# Patient Record
Sex: Female | Born: 1945 | Race: White | Hispanic: No | Marital: Married | Smoking: Never smoker
Health system: Southern US, Community
[De-identification: ages and names within clinical notes are randomized; demographics above are authoritative.]

## PROBLEM LIST (undated history)

## (undated) DIAGNOSIS — F419 Anxiety disorder, unspecified: Secondary | ICD-10-CM

## (undated) DIAGNOSIS — I10 Essential (primary) hypertension: Secondary | ICD-10-CM

## (undated) HISTORY — PX: BREAST BIOPSY: SHX20

## (undated) HISTORY — PX: VAGINAL PROLAPSE REPAIR: SHX830

## (undated) HISTORY — PX: VAGINAL HYSTERECTOMY: SUR661

---

## 2004-07-24 ENCOUNTER — Ambulatory Visit: Payer: Self-pay

## 2004-09-04 ENCOUNTER — Ambulatory Visit: Payer: Self-pay

## 2005-09-08 ENCOUNTER — Ambulatory Visit: Payer: Self-pay

## 2006-09-14 ENCOUNTER — Ambulatory Visit: Payer: Self-pay | Admitting: Internal Medicine

## 2007-09-27 ENCOUNTER — Ambulatory Visit: Payer: Self-pay | Admitting: Internal Medicine

## 2008-09-28 ENCOUNTER — Ambulatory Visit: Payer: Self-pay | Admitting: Internal Medicine

## 2009-01-30 ENCOUNTER — Ambulatory Visit: Payer: Self-pay | Admitting: Unknown Physician Specialty

## 2009-10-04 ENCOUNTER — Ambulatory Visit: Payer: Self-pay | Admitting: Internal Medicine

## 2010-10-10 ENCOUNTER — Ambulatory Visit: Payer: Self-pay | Admitting: Internal Medicine

## 2011-09-02 DIAGNOSIS — E063 Autoimmune thyroiditis: Secondary | ICD-10-CM | POA: Diagnosis not present

## 2011-09-02 DIAGNOSIS — D34 Benign neoplasm of thyroid gland: Secondary | ICD-10-CM | POA: Diagnosis not present

## 2011-09-02 DIAGNOSIS — E041 Nontoxic single thyroid nodule: Secondary | ICD-10-CM | POA: Diagnosis not present

## 2011-09-04 DIAGNOSIS — Z124 Encounter for screening for malignant neoplasm of cervix: Secondary | ICD-10-CM | POA: Diagnosis not present

## 2011-09-04 DIAGNOSIS — E2839 Other primary ovarian failure: Secondary | ICD-10-CM | POA: Diagnosis not present

## 2011-09-04 DIAGNOSIS — E040 Nontoxic diffuse goiter: Secondary | ICD-10-CM | POA: Diagnosis not present

## 2011-09-04 DIAGNOSIS — Z0000 Encounter for general adult medical examination without abnormal findings: Secondary | ICD-10-CM | POA: Diagnosis not present

## 2011-09-04 DIAGNOSIS — N812 Incomplete uterovaginal prolapse: Secondary | ICD-10-CM | POA: Diagnosis not present

## 2011-09-05 DIAGNOSIS — E039 Hypothyroidism, unspecified: Secondary | ICD-10-CM | POA: Diagnosis not present

## 2011-09-05 DIAGNOSIS — D34 Benign neoplasm of thyroid gland: Secondary | ICD-10-CM | POA: Diagnosis not present

## 2011-09-05 DIAGNOSIS — E041 Nontoxic single thyroid nodule: Secondary | ICD-10-CM | POA: Diagnosis not present

## 2011-09-05 DIAGNOSIS — E049 Nontoxic goiter, unspecified: Secondary | ICD-10-CM | POA: Diagnosis not present

## 2011-09-10 DIAGNOSIS — E041 Nontoxic single thyroid nodule: Secondary | ICD-10-CM | POA: Diagnosis not present

## 2011-10-14 ENCOUNTER — Ambulatory Visit: Payer: Self-pay | Admitting: Internal Medicine

## 2011-10-14 DIAGNOSIS — Z1231 Encounter for screening mammogram for malignant neoplasm of breast: Secondary | ICD-10-CM | POA: Diagnosis not present

## 2011-10-15 DIAGNOSIS — J019 Acute sinusitis, unspecified: Secondary | ICD-10-CM | POA: Diagnosis not present

## 2011-12-04 ENCOUNTER — Ambulatory Visit: Payer: Self-pay | Admitting: Otolaryngology

## 2011-12-04 DIAGNOSIS — E041 Nontoxic single thyroid nodule: Secondary | ICD-10-CM | POA: Diagnosis not present

## 2011-12-04 LAB — T4, FREE: Free Thyroxine: 0.76 ng/dL (ref 0.76–1.46)

## 2011-12-09 DIAGNOSIS — E041 Nontoxic single thyroid nodule: Secondary | ICD-10-CM | POA: Diagnosis not present

## 2012-01-01 DIAGNOSIS — H2510 Age-related nuclear cataract, unspecified eye: Secondary | ICD-10-CM | POA: Diagnosis not present

## 2012-03-16 ENCOUNTER — Ambulatory Visit: Payer: Self-pay | Admitting: Otolaryngology

## 2012-03-16 DIAGNOSIS — E042 Nontoxic multinodular goiter: Secondary | ICD-10-CM | POA: Diagnosis not present

## 2012-03-18 ENCOUNTER — Ambulatory Visit: Payer: Self-pay | Admitting: Otolaryngology

## 2012-03-18 DIAGNOSIS — E041 Nontoxic single thyroid nodule: Secondary | ICD-10-CM | POA: Diagnosis not present

## 2012-03-18 LAB — T4, FREE: Free Thyroxine: 0.8 ng/dL (ref 0.76–1.46)

## 2012-03-18 LAB — TSH: Thyroid Stimulating Horm: 1.22 u[IU]/mL

## 2012-03-22 DIAGNOSIS — E041 Nontoxic single thyroid nodule: Secondary | ICD-10-CM | POA: Diagnosis not present

## 2012-04-01 ENCOUNTER — Ambulatory Visit: Payer: Self-pay | Admitting: Family Medicine

## 2012-04-01 DIAGNOSIS — M4310 Spondylolisthesis, site unspecified: Secondary | ICD-10-CM | POA: Diagnosis not present

## 2012-04-01 DIAGNOSIS — M5030 Other cervical disc degeneration, unspecified cervical region: Secondary | ICD-10-CM | POA: Diagnosis not present

## 2012-04-07 DIAGNOSIS — M999 Biomechanical lesion, unspecified: Secondary | ICD-10-CM | POA: Diagnosis not present

## 2012-04-07 DIAGNOSIS — M531 Cervicobrachial syndrome: Secondary | ICD-10-CM | POA: Diagnosis not present

## 2012-04-07 DIAGNOSIS — M9981 Other biomechanical lesions of cervical region: Secondary | ICD-10-CM | POA: Diagnosis not present

## 2012-04-07 DIAGNOSIS — IMO0002 Reserved for concepts with insufficient information to code with codable children: Secondary | ICD-10-CM | POA: Diagnosis not present

## 2012-04-08 DIAGNOSIS — IMO0002 Reserved for concepts with insufficient information to code with codable children: Secondary | ICD-10-CM | POA: Diagnosis not present

## 2012-04-08 DIAGNOSIS — M531 Cervicobrachial syndrome: Secondary | ICD-10-CM | POA: Diagnosis not present

## 2012-04-08 DIAGNOSIS — M999 Biomechanical lesion, unspecified: Secondary | ICD-10-CM | POA: Diagnosis not present

## 2012-04-08 DIAGNOSIS — M9981 Other biomechanical lesions of cervical region: Secondary | ICD-10-CM | POA: Diagnosis not present

## 2012-04-09 DIAGNOSIS — IMO0002 Reserved for concepts with insufficient information to code with codable children: Secondary | ICD-10-CM | POA: Diagnosis not present

## 2012-04-09 DIAGNOSIS — M9981 Other biomechanical lesions of cervical region: Secondary | ICD-10-CM | POA: Diagnosis not present

## 2012-04-09 DIAGNOSIS — M531 Cervicobrachial syndrome: Secondary | ICD-10-CM | POA: Diagnosis not present

## 2012-04-09 DIAGNOSIS — M999 Biomechanical lesion, unspecified: Secondary | ICD-10-CM | POA: Diagnosis not present

## 2012-04-13 DIAGNOSIS — M9981 Other biomechanical lesions of cervical region: Secondary | ICD-10-CM | POA: Diagnosis not present

## 2012-04-13 DIAGNOSIS — M531 Cervicobrachial syndrome: Secondary | ICD-10-CM | POA: Diagnosis not present

## 2012-04-13 DIAGNOSIS — M999 Biomechanical lesion, unspecified: Secondary | ICD-10-CM | POA: Diagnosis not present

## 2012-04-13 DIAGNOSIS — IMO0002 Reserved for concepts with insufficient information to code with codable children: Secondary | ICD-10-CM | POA: Diagnosis not present

## 2012-04-16 DIAGNOSIS — M9981 Other biomechanical lesions of cervical region: Secondary | ICD-10-CM | POA: Diagnosis not present

## 2012-04-16 DIAGNOSIS — M531 Cervicobrachial syndrome: Secondary | ICD-10-CM | POA: Diagnosis not present

## 2012-04-16 DIAGNOSIS — IMO0002 Reserved for concepts with insufficient information to code with codable children: Secondary | ICD-10-CM | POA: Diagnosis not present

## 2012-04-16 DIAGNOSIS — M999 Biomechanical lesion, unspecified: Secondary | ICD-10-CM | POA: Diagnosis not present

## 2012-04-19 DIAGNOSIS — M531 Cervicobrachial syndrome: Secondary | ICD-10-CM | POA: Diagnosis not present

## 2012-04-19 DIAGNOSIS — IMO0002 Reserved for concepts with insufficient information to code with codable children: Secondary | ICD-10-CM | POA: Diagnosis not present

## 2012-04-19 DIAGNOSIS — M999 Biomechanical lesion, unspecified: Secondary | ICD-10-CM | POA: Diagnosis not present

## 2012-04-19 DIAGNOSIS — M9981 Other biomechanical lesions of cervical region: Secondary | ICD-10-CM | POA: Diagnosis not present

## 2012-05-07 DIAGNOSIS — M999 Biomechanical lesion, unspecified: Secondary | ICD-10-CM | POA: Diagnosis not present

## 2012-05-07 DIAGNOSIS — IMO0002 Reserved for concepts with insufficient information to code with codable children: Secondary | ICD-10-CM | POA: Diagnosis not present

## 2012-05-07 DIAGNOSIS — M531 Cervicobrachial syndrome: Secondary | ICD-10-CM | POA: Diagnosis not present

## 2012-05-07 DIAGNOSIS — M9981 Other biomechanical lesions of cervical region: Secondary | ICD-10-CM | POA: Diagnosis not present

## 2012-05-10 DIAGNOSIS — IMO0002 Reserved for concepts with insufficient information to code with codable children: Secondary | ICD-10-CM | POA: Diagnosis not present

## 2012-05-10 DIAGNOSIS — M531 Cervicobrachial syndrome: Secondary | ICD-10-CM | POA: Diagnosis not present

## 2012-05-10 DIAGNOSIS — M9981 Other biomechanical lesions of cervical region: Secondary | ICD-10-CM | POA: Diagnosis not present

## 2012-05-10 DIAGNOSIS — M999 Biomechanical lesion, unspecified: Secondary | ICD-10-CM | POA: Diagnosis not present

## 2012-05-12 DIAGNOSIS — M9981 Other biomechanical lesions of cervical region: Secondary | ICD-10-CM | POA: Diagnosis not present

## 2012-05-12 DIAGNOSIS — Z23 Encounter for immunization: Secondary | ICD-10-CM | POA: Diagnosis not present

## 2012-05-12 DIAGNOSIS — M999 Biomechanical lesion, unspecified: Secondary | ICD-10-CM | POA: Diagnosis not present

## 2012-05-12 DIAGNOSIS — IMO0002 Reserved for concepts with insufficient information to code with codable children: Secondary | ICD-10-CM | POA: Diagnosis not present

## 2012-05-12 DIAGNOSIS — M531 Cervicobrachial syndrome: Secondary | ICD-10-CM | POA: Diagnosis not present

## 2012-05-14 DIAGNOSIS — M531 Cervicobrachial syndrome: Secondary | ICD-10-CM | POA: Diagnosis not present

## 2012-05-14 DIAGNOSIS — IMO0002 Reserved for concepts with insufficient information to code with codable children: Secondary | ICD-10-CM | POA: Diagnosis not present

## 2012-05-14 DIAGNOSIS — M9981 Other biomechanical lesions of cervical region: Secondary | ICD-10-CM | POA: Diagnosis not present

## 2012-05-14 DIAGNOSIS — M999 Biomechanical lesion, unspecified: Secondary | ICD-10-CM | POA: Diagnosis not present

## 2012-05-19 DIAGNOSIS — IMO0002 Reserved for concepts with insufficient information to code with codable children: Secondary | ICD-10-CM | POA: Diagnosis not present

## 2012-05-19 DIAGNOSIS — M531 Cervicobrachial syndrome: Secondary | ICD-10-CM | POA: Diagnosis not present

## 2012-05-19 DIAGNOSIS — M999 Biomechanical lesion, unspecified: Secondary | ICD-10-CM | POA: Diagnosis not present

## 2012-05-19 DIAGNOSIS — M9981 Other biomechanical lesions of cervical region: Secondary | ICD-10-CM | POA: Diagnosis not present

## 2012-05-21 DIAGNOSIS — M531 Cervicobrachial syndrome: Secondary | ICD-10-CM | POA: Diagnosis not present

## 2012-05-21 DIAGNOSIS — IMO0002 Reserved for concepts with insufficient information to code with codable children: Secondary | ICD-10-CM | POA: Diagnosis not present

## 2012-05-21 DIAGNOSIS — M9981 Other biomechanical lesions of cervical region: Secondary | ICD-10-CM | POA: Diagnosis not present

## 2012-05-21 DIAGNOSIS — M999 Biomechanical lesion, unspecified: Secondary | ICD-10-CM | POA: Diagnosis not present

## 2012-08-09 DIAGNOSIS — J40 Bronchitis, not specified as acute or chronic: Secondary | ICD-10-CM | POA: Diagnosis not present

## 2012-09-07 DIAGNOSIS — E782 Mixed hyperlipidemia: Secondary | ICD-10-CM | POA: Diagnosis not present

## 2012-09-07 DIAGNOSIS — R002 Palpitations: Secondary | ICD-10-CM | POA: Diagnosis not present

## 2012-09-07 DIAGNOSIS — E040 Nontoxic diffuse goiter: Secondary | ICD-10-CM | POA: Diagnosis not present

## 2012-09-07 DIAGNOSIS — Z23 Encounter for immunization: Secondary | ICD-10-CM | POA: Diagnosis not present

## 2012-09-07 DIAGNOSIS — Z0000 Encounter for general adult medical examination without abnormal findings: Secondary | ICD-10-CM | POA: Diagnosis not present

## 2012-09-07 DIAGNOSIS — G4700 Insomnia, unspecified: Secondary | ICD-10-CM | POA: Diagnosis not present

## 2012-09-07 DIAGNOSIS — N812 Incomplete uterovaginal prolapse: Secondary | ICD-10-CM | POA: Diagnosis not present

## 2012-09-07 DIAGNOSIS — M159 Polyosteoarthritis, unspecified: Secondary | ICD-10-CM | POA: Diagnosis not present

## 2012-09-07 DIAGNOSIS — R300 Dysuria: Secondary | ICD-10-CM | POA: Diagnosis not present

## 2012-09-08 DIAGNOSIS — N8110 Cystocele, unspecified: Secondary | ICD-10-CM | POA: Diagnosis not present

## 2012-09-08 DIAGNOSIS — K5900 Constipation, unspecified: Secondary | ICD-10-CM | POA: Diagnosis not present

## 2012-09-08 DIAGNOSIS — IMO0002 Reserved for concepts with insufficient information to code with codable children: Secondary | ICD-10-CM | POA: Diagnosis not present

## 2012-09-08 DIAGNOSIS — N819 Female genital prolapse, unspecified: Secondary | ICD-10-CM | POA: Insufficient documentation

## 2012-10-05 DIAGNOSIS — D239 Other benign neoplasm of skin, unspecified: Secondary | ICD-10-CM | POA: Diagnosis not present

## 2012-10-14 ENCOUNTER — Ambulatory Visit: Payer: Self-pay | Admitting: Internal Medicine

## 2012-10-14 DIAGNOSIS — Z1231 Encounter for screening mammogram for malignant neoplasm of breast: Secondary | ICD-10-CM | POA: Diagnosis not present

## 2012-11-01 DIAGNOSIS — G5600 Carpal tunnel syndrome, unspecified upper limb: Secondary | ICD-10-CM | POA: Diagnosis not present

## 2012-11-19 ENCOUNTER — Ambulatory Visit: Payer: Self-pay | Admitting: Otolaryngology

## 2012-11-19 DIAGNOSIS — E041 Nontoxic single thyroid nodule: Secondary | ICD-10-CM | POA: Diagnosis not present

## 2012-11-19 LAB — TSH: Thyroid Stimulating Horm: 1.07 u[IU]/mL

## 2012-11-19 LAB — T4, FREE: Free Thyroxine: 1 ng/dL (ref 0.76–1.46)

## 2012-12-07 DIAGNOSIS — L0201 Cutaneous abscess of face: Secondary | ICD-10-CM | POA: Diagnosis not present

## 2012-12-07 DIAGNOSIS — E041 Nontoxic single thyroid nodule: Secondary | ICD-10-CM | POA: Diagnosis not present

## 2013-04-26 DIAGNOSIS — L821 Other seborrheic keratosis: Secondary | ICD-10-CM | POA: Diagnosis not present

## 2013-04-26 DIAGNOSIS — Z872 Personal history of diseases of the skin and subcutaneous tissue: Secondary | ICD-10-CM | POA: Diagnosis not present

## 2013-04-26 DIAGNOSIS — Z1283 Encounter for screening for malignant neoplasm of skin: Secondary | ICD-10-CM | POA: Diagnosis not present

## 2013-04-27 IMAGING — CR CERVICAL SPINE - COMPLETE 4+ VIEW
1 series · 7 of 7 positions shown · non-contrast
Comparison: none

REASON FOR EXAM: cervical pain
COMMENTS:

PROCEDURE:     MDR - MDR CERVICAL SPINE COMPLETE  - April 01, 2012 [DATE]
RESULT:     Comparison: None.

[Series 1: lat · 0.17mm/px · 7 of 7 slices shown]
[im 1/7]
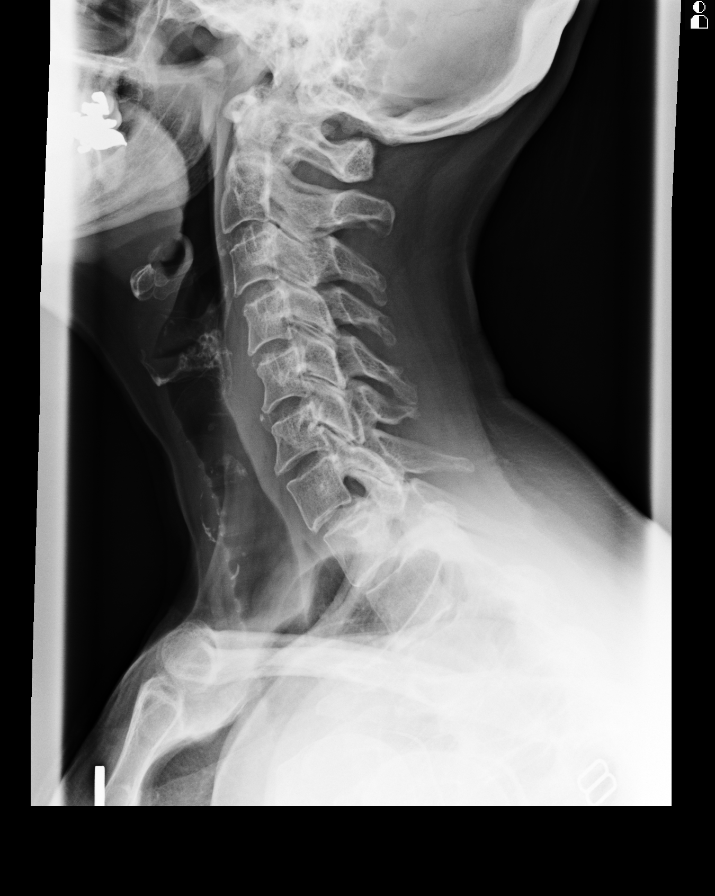
[im 2/7]
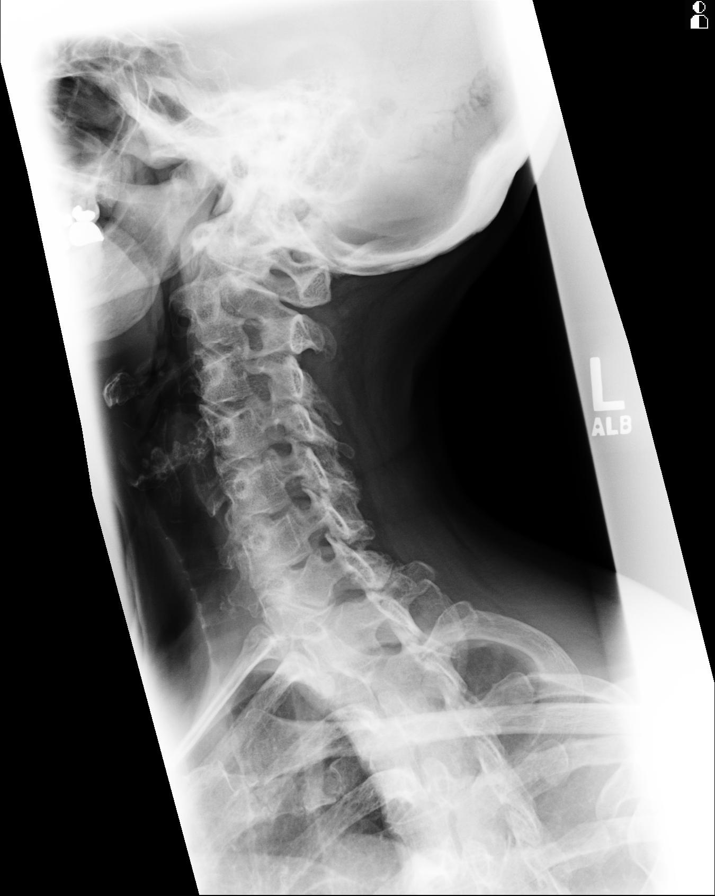
[im 3/7]
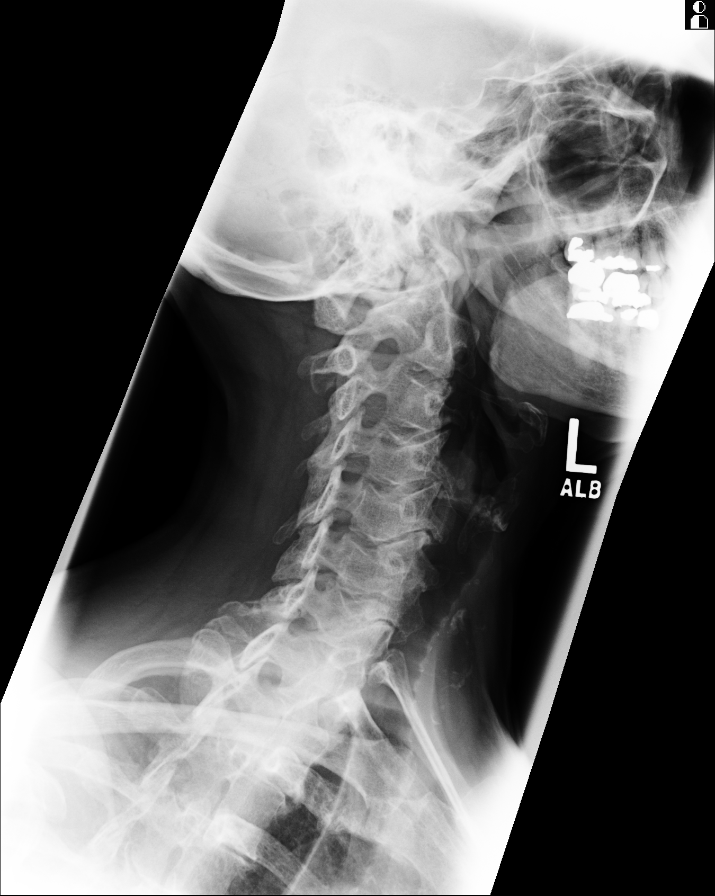
[im 4/7]
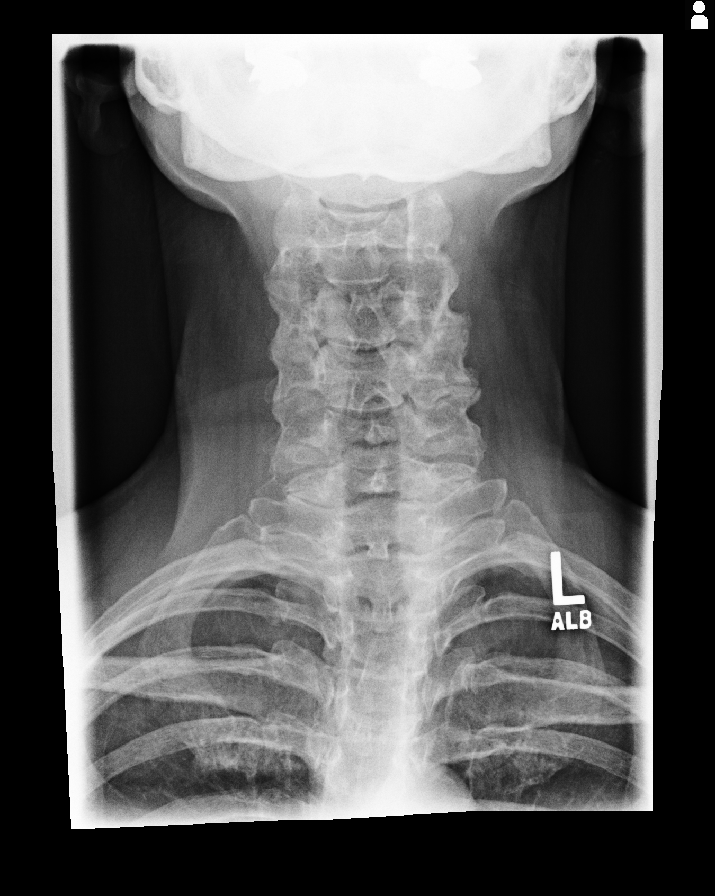
[im 5/7]
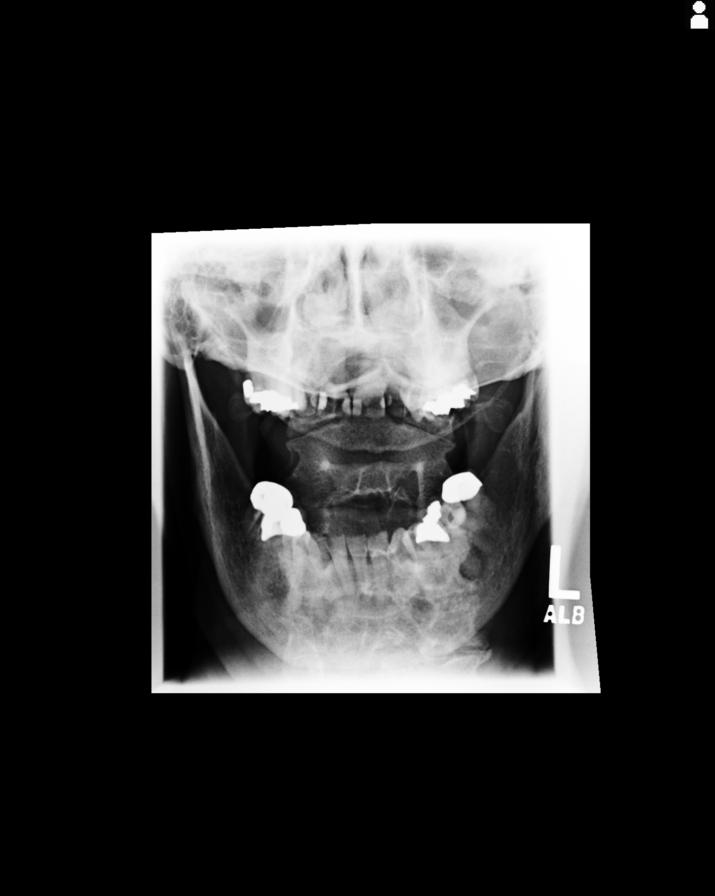
[im 6/7]
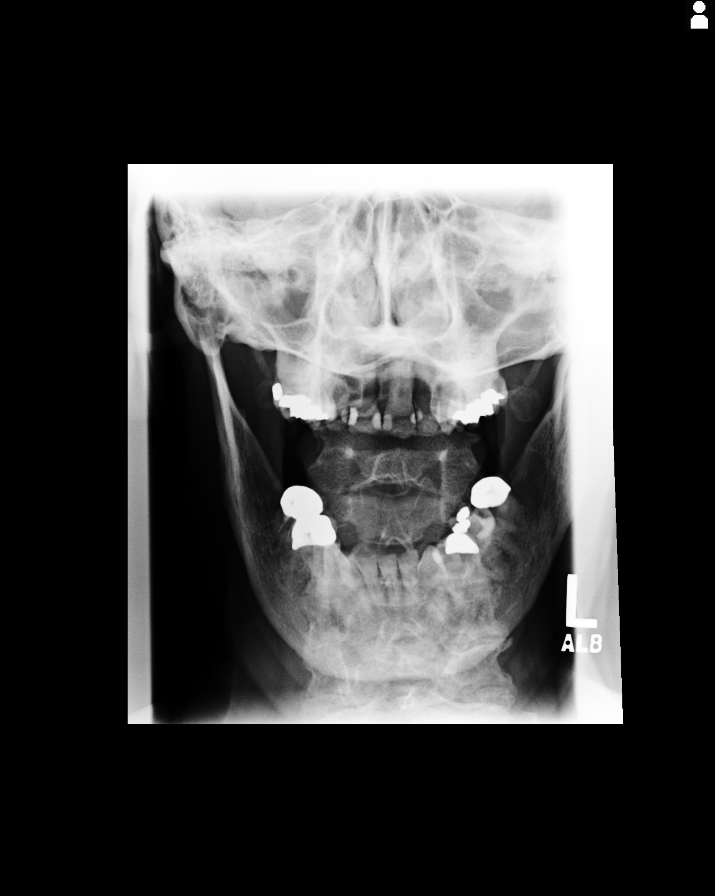
[im 7/7]
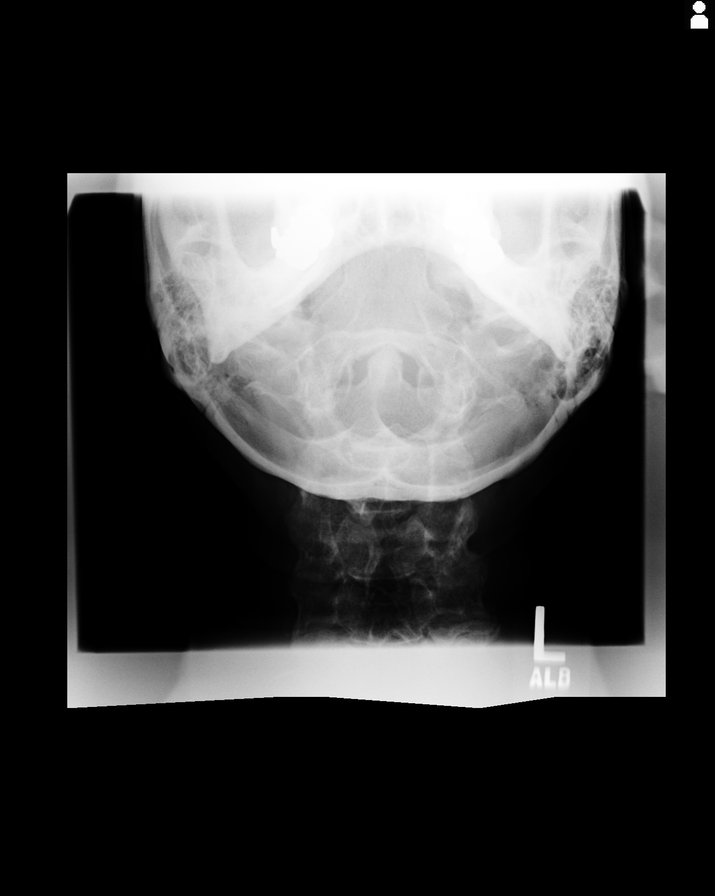

[7 of 7 positions shown; findings below may reference images not displayed]

FINDINGS: The cervical spine is imaged from the skull base through C7. There is slight
anterolisthesis of C7 on T1. There is minimal intervertebral disc loss and
osteophytosis at C6-C7. The prevertebral soft tissues are within normal
limits. The neuroforamina are patent.
IMPRESSION: 1. Minimal degenerative disease at C6-C7.
2. Minimal anterolisthesis of C7 on T[DATE] be degenerative. If there is a
traumatic history, ligamentous injury is not excluded.

If there is clinical concern for occult cervical spine fracture, further
evaluation with CT is recommended.

[REDACTED]

## 2013-06-09 DIAGNOSIS — H2510 Age-related nuclear cataract, unspecified eye: Secondary | ICD-10-CM | POA: Diagnosis not present

## 2013-06-13 DIAGNOSIS — Z23 Encounter for immunization: Secondary | ICD-10-CM | POA: Diagnosis not present

## 2013-07-11 DIAGNOSIS — N393 Stress incontinence (female) (male): Secondary | ICD-10-CM | POA: Diagnosis not present

## 2013-07-11 DIAGNOSIS — K5900 Constipation, unspecified: Secondary | ICD-10-CM | POA: Diagnosis not present

## 2013-07-11 DIAGNOSIS — N8110 Cystocele, unspecified: Secondary | ICD-10-CM | POA: Diagnosis not present

## 2013-08-02 DIAGNOSIS — Z01818 Encounter for other preprocedural examination: Secondary | ICD-10-CM | POA: Diagnosis not present

## 2013-08-02 DIAGNOSIS — E042 Nontoxic multinodular goiter: Secondary | ICD-10-CM | POA: Insufficient documentation

## 2013-08-02 DIAGNOSIS — G8918 Other acute postprocedural pain: Secondary | ICD-10-CM | POA: Diagnosis not present

## 2013-08-02 DIAGNOSIS — R300 Dysuria: Secondary | ICD-10-CM | POA: Diagnosis not present

## 2013-08-02 DIAGNOSIS — K5900 Constipation, unspecified: Secondary | ICD-10-CM | POA: Diagnosis not present

## 2013-08-02 DIAGNOSIS — N8110 Cystocele, unspecified: Secondary | ICD-10-CM | POA: Diagnosis not present

## 2013-09-12 DIAGNOSIS — E782 Mixed hyperlipidemia: Secondary | ICD-10-CM | POA: Diagnosis not present

## 2013-09-12 DIAGNOSIS — N812 Incomplete uterovaginal prolapse: Secondary | ICD-10-CM | POA: Diagnosis not present

## 2013-09-12 DIAGNOSIS — M159 Polyosteoarthritis, unspecified: Secondary | ICD-10-CM | POA: Diagnosis not present

## 2013-09-12 DIAGNOSIS — R300 Dysuria: Secondary | ICD-10-CM | POA: Diagnosis not present

## 2013-09-12 DIAGNOSIS — Z0000 Encounter for general adult medical examination without abnormal findings: Secondary | ICD-10-CM | POA: Diagnosis not present

## 2013-09-12 DIAGNOSIS — N390 Urinary tract infection, site not specified: Secondary | ICD-10-CM | POA: Diagnosis not present

## 2013-09-12 DIAGNOSIS — G4700 Insomnia, unspecified: Secondary | ICD-10-CM | POA: Diagnosis not present

## 2013-09-20 DIAGNOSIS — Z01818 Encounter for other preprocedural examination: Secondary | ICD-10-CM | POA: Diagnosis not present

## 2013-09-20 DIAGNOSIS — N390 Urinary tract infection, site not specified: Secondary | ICD-10-CM | POA: Diagnosis not present

## 2013-09-26 DIAGNOSIS — Z90710 Acquired absence of both cervix and uterus: Secondary | ICD-10-CM | POA: Diagnosis not present

## 2013-09-26 DIAGNOSIS — N993 Prolapse of vaginal vault after hysterectomy: Secondary | ICD-10-CM | POA: Diagnosis not present

## 2013-09-26 DIAGNOSIS — N8111 Cystocele, midline: Secondary | ICD-10-CM | POA: Diagnosis not present

## 2013-09-26 DIAGNOSIS — N393 Stress incontinence (female) (male): Secondary | ICD-10-CM | POA: Diagnosis not present

## 2013-09-26 DIAGNOSIS — N815 Vaginal enterocele: Secondary | ICD-10-CM | POA: Diagnosis not present

## 2013-09-26 DIAGNOSIS — Z9079 Acquired absence of other genital organ(s): Secondary | ICD-10-CM | POA: Diagnosis not present

## 2013-09-26 DIAGNOSIS — N210 Calculus in bladder: Secondary | ICD-10-CM | POA: Diagnosis not present

## 2013-10-25 ENCOUNTER — Ambulatory Visit: Payer: Self-pay | Admitting: Internal Medicine

## 2013-10-25 DIAGNOSIS — Z1231 Encounter for screening mammogram for malignant neoplasm of breast: Secondary | ICD-10-CM | POA: Diagnosis not present

## 2013-11-01 DIAGNOSIS — Z4889 Encounter for other specified surgical aftercare: Secondary | ICD-10-CM | POA: Insufficient documentation

## 2013-12-05 DIAGNOSIS — E2839 Other primary ovarian failure: Secondary | ICD-10-CM | POA: Diagnosis not present

## 2014-04-07 DIAGNOSIS — Z800 Family history of malignant neoplasm of digestive organs: Secondary | ICD-10-CM | POA: Diagnosis not present

## 2014-04-11 ENCOUNTER — Ambulatory Visit: Payer: Self-pay | Admitting: Otolaryngology

## 2014-04-11 DIAGNOSIS — E041 Nontoxic single thyroid nodule: Secondary | ICD-10-CM | POA: Diagnosis not present

## 2014-04-11 DIAGNOSIS — E042 Nontoxic multinodular goiter: Secondary | ICD-10-CM | POA: Diagnosis not present

## 2014-04-11 LAB — T4, FREE: Free Thyroxine: 0.86 ng/dL (ref 0.76–1.46)

## 2014-04-11 LAB — TSH: Thyroid Stimulating Horm: 1.08 u[IU]/mL

## 2014-04-25 DIAGNOSIS — E041 Nontoxic single thyroid nodule: Secondary | ICD-10-CM | POA: Diagnosis not present

## 2014-06-09 DIAGNOSIS — Z23 Encounter for immunization: Secondary | ICD-10-CM | POA: Diagnosis not present

## 2014-06-14 DIAGNOSIS — H2513 Age-related nuclear cataract, bilateral: Secondary | ICD-10-CM | POA: Diagnosis not present

## 2014-06-20 DIAGNOSIS — K648 Other hemorrhoids: Secondary | ICD-10-CM | POA: Diagnosis not present

## 2014-06-20 DIAGNOSIS — K5790 Diverticulosis of intestine, part unspecified, without perforation or abscess without bleeding: Secondary | ICD-10-CM | POA: Diagnosis not present

## 2014-06-20 DIAGNOSIS — Z1211 Encounter for screening for malignant neoplasm of colon: Secondary | ICD-10-CM | POA: Diagnosis not present

## 2014-06-20 DIAGNOSIS — Z800 Family history of malignant neoplasm of digestive organs: Secondary | ICD-10-CM | POA: Diagnosis not present

## 2014-07-25 DIAGNOSIS — Z1283 Encounter for screening for malignant neoplasm of skin: Secondary | ICD-10-CM | POA: Diagnosis not present

## 2014-07-25 DIAGNOSIS — Z872 Personal history of diseases of the skin and subcutaneous tissue: Secondary | ICD-10-CM | POA: Diagnosis not present

## 2014-07-25 DIAGNOSIS — L821 Other seborrheic keratosis: Secondary | ICD-10-CM | POA: Diagnosis not present

## 2014-08-11 HISTORY — PX: COLONOSCOPY: SHX5424

## 2014-08-17 ENCOUNTER — Ambulatory Visit: Payer: Self-pay | Admitting: Internal Medicine

## 2014-08-17 DIAGNOSIS — R0789 Other chest pain: Secondary | ICD-10-CM | POA: Diagnosis not present

## 2014-08-17 DIAGNOSIS — R079 Chest pain, unspecified: Secondary | ICD-10-CM | POA: Diagnosis not present

## 2014-08-17 DIAGNOSIS — E782 Mixed hyperlipidemia: Secondary | ICD-10-CM | POA: Diagnosis not present

## 2014-08-17 DIAGNOSIS — M150 Primary generalized (osteo)arthritis: Secondary | ICD-10-CM | POA: Diagnosis not present

## 2014-09-06 ENCOUNTER — Ambulatory Visit: Payer: Self-pay | Admitting: Internal Medicine

## 2014-09-06 DIAGNOSIS — R079 Chest pain, unspecified: Secondary | ICD-10-CM | POA: Diagnosis not present

## 2014-09-06 DIAGNOSIS — I209 Angina pectoris, unspecified: Secondary | ICD-10-CM | POA: Diagnosis not present

## 2014-09-07 DIAGNOSIS — M150 Primary generalized (osteo)arthritis: Secondary | ICD-10-CM | POA: Diagnosis not present

## 2014-09-07 DIAGNOSIS — R079 Chest pain, unspecified: Secondary | ICD-10-CM | POA: Diagnosis not present

## 2014-11-08 ENCOUNTER — Ambulatory Visit: Admit: 2014-11-08 | Disposition: A | Discharge status: Home / Self Care | Payer: Self-pay | Admitting: Internal Medicine

## 2014-11-08 DIAGNOSIS — Z1231 Encounter for screening mammogram for malignant neoplasm of breast: Secondary | ICD-10-CM | POA: Diagnosis not present

## 2014-12-28 DIAGNOSIS — I10 Essential (primary) hypertension: Secondary | ICD-10-CM | POA: Diagnosis not present

## 2014-12-28 DIAGNOSIS — E782 Mixed hyperlipidemia: Secondary | ICD-10-CM | POA: Diagnosis not present

## 2014-12-28 DIAGNOSIS — E2839 Other primary ovarian failure: Secondary | ICD-10-CM | POA: Diagnosis not present

## 2014-12-28 DIAGNOSIS — Z0001 Encounter for general adult medical examination with abnormal findings: Secondary | ICD-10-CM | POA: Diagnosis not present

## 2014-12-28 DIAGNOSIS — E040 Nontoxic diffuse goiter: Secondary | ICD-10-CM | POA: Diagnosis not present

## 2014-12-28 DIAGNOSIS — R300 Dysuria: Secondary | ICD-10-CM | POA: Diagnosis not present

## 2015-01-02 DIAGNOSIS — D6481 Anemia due to antineoplastic chemotherapy: Secondary | ICD-10-CM | POA: Diagnosis not present

## 2015-01-02 DIAGNOSIS — E538 Deficiency of other specified B group vitamins: Secondary | ICD-10-CM | POA: Diagnosis not present

## 2015-01-02 DIAGNOSIS — Z0001 Encounter for general adult medical examination with abnormal findings: Secondary | ICD-10-CM | POA: Diagnosis not present

## 2015-01-02 DIAGNOSIS — T8069XA Other serum reaction due to other serum, initial encounter: Secondary | ICD-10-CM | POA: Diagnosis not present

## 2015-01-02 DIAGNOSIS — E030 Congenital hypothyroidism with diffuse goiter: Secondary | ICD-10-CM | POA: Diagnosis not present

## 2015-01-02 DIAGNOSIS — E782 Mixed hyperlipidemia: Secondary | ICD-10-CM | POA: Diagnosis not present

## 2015-06-02 DIAGNOSIS — Z23 Encounter for immunization: Secondary | ICD-10-CM | POA: Diagnosis not present

## 2015-06-20 DIAGNOSIS — H25813 Combined forms of age-related cataract, bilateral: Secondary | ICD-10-CM | POA: Diagnosis not present

## 2015-07-10 ENCOUNTER — Other Ambulatory Visit
Admission: RE | Admit: 2015-07-10 | Discharge: 2015-07-10 | Disposition: A | Discharge status: Home / Self Care | Payer: Medicare Other | Source: Ambulatory Visit | Attending: Otolaryngology | Admitting: Otolaryngology

## 2015-07-10 DIAGNOSIS — E041 Nontoxic single thyroid nodule: Secondary | ICD-10-CM | POA: Insufficient documentation

## 2015-07-10 LAB — TSH: TSH: 1.198 u[IU]/mL (ref 0.350–4.500)

## 2015-07-10 LAB — T4, FREE: FREE T4: 0.66 ng/dL (ref 0.61–1.12)

## 2015-07-11 LAB — T3, FREE: T3, Free: 3.1 pg/mL (ref 2.0–4.4)

## 2015-07-16 DIAGNOSIS — E041 Nontoxic single thyroid nodule: Secondary | ICD-10-CM | POA: Diagnosis not present

## 2015-07-16 DIAGNOSIS — R5381 Other malaise: Secondary | ICD-10-CM | POA: Diagnosis not present

## 2015-07-25 DIAGNOSIS — L821 Other seborrheic keratosis: Secondary | ICD-10-CM | POA: Diagnosis not present

## 2015-07-25 DIAGNOSIS — Z872 Personal history of diseases of the skin and subcutaneous tissue: Secondary | ICD-10-CM | POA: Diagnosis not present

## 2015-07-25 DIAGNOSIS — Z1283 Encounter for screening for malignant neoplasm of skin: Secondary | ICD-10-CM | POA: Diagnosis not present

## 2015-07-25 DIAGNOSIS — D485 Neoplasm of uncertain behavior of skin: Secondary | ICD-10-CM | POA: Diagnosis not present

## 2015-09-05 DIAGNOSIS — D485 Neoplasm of uncertain behavior of skin: Secondary | ICD-10-CM | POA: Diagnosis not present

## 2015-09-05 DIAGNOSIS — D2221 Melanocytic nevi of right ear and external auricular canal: Secondary | ICD-10-CM | POA: Diagnosis not present

## 2015-09-05 DIAGNOSIS — D2272 Melanocytic nevi of left lower limb, including hip: Secondary | ICD-10-CM | POA: Diagnosis not present

## 2015-11-12 ENCOUNTER — Ambulatory Visit (INDEPENDENT_AMBULATORY_CARE_PROVIDER_SITE_OTHER): Payer: Medicare Other | Admitting: Family Medicine

## 2015-11-12 ENCOUNTER — Encounter: Payer: Self-pay | Admitting: Family Medicine

## 2015-11-12 VITALS — BP 138/80 | HR 68 | Temp 98.3°F | Ht 68.0 in | Wt 153.0 lb

## 2015-11-12 DIAGNOSIS — J0100 Acute maxillary sinusitis, unspecified: Secondary | ICD-10-CM | POA: Diagnosis not present

## 2015-11-12 MED ORDER — FLUCONAZOLE 150 MG PO TABS: 150.0000 mg | ORAL_TABLET | Freq: Once | ORAL | Status: DC

## 2015-11-12 MED ORDER — AMOXICILLIN 500 MG PO CAPS: 500.0000 mg | ORAL_CAPSULE | Freq: Three times a day (TID) | ORAL | Status: DC

## 2015-11-12 NOTE — Progress Notes (Signed)
Name: Cindy Duke   MRN: [id number]    DOB: Feb 03, 1946   Date:11/12/2015       Progress Note  Subjective  Chief Complaint  Chief Complaint  Patient presents with  . Sinusitis    Pressure under eyes    Sinusitis This is a chronic problem. The current episode started in the past 7 days. The problem has been gradually worsening since onset. There has been no fever. Her pain is at a severity of 5/10. The pain is mild. Associated symptoms include congestion, coughing, a hoarse voice, sinus pressure, a sore throat and swollen glands. Pertinent negatives include no chills, diaphoresis, ear pain, headaches, neck pain, shortness of breath or sneezing. Past treatments include acetaminophen and oral decongestants. The treatment provided mild relief.    No problem-specific assessment & plan notes found for this encounter.   No past medical history on file.  Past Surgical History  Procedure Laterality Date  . Vaginal hysterectomy    . Vaginal prolapse repair    . Colonoscopy  2016    Dr Vira Agar- cleared for 5 yrs    No family history on file.  Social History   Social History  . Marital Status: Married    Spouse Name: N/A  . Number of Children: N/A  . Years of Education: N/A   Occupational History  . Not on file.   Social History Main Topics  . Smoking status: Never Smoker   . Smokeless tobacco: Not on file  . Alcohol Use: No  . Drug Use: No  . Sexual Activity: Not on file   Other Topics Concern  . Not on file   Social History Narrative  . No narrative on file    Allergies  Allergen Reactions  . Codeine     Gives headache     Review of Systems  Constitutional: Negative for fever, chills, weight loss, malaise/fatigue and diaphoresis.  HENT: Positive for congestion, hoarse voice, sinus pressure and sore throat. Negative for ear discharge, ear pain and sneezing.   Eyes: Negative for blurred vision.  Respiratory: Positive for cough. Negative for sputum  production, shortness of breath and wheezing.   Cardiovascular: Negative for chest pain, palpitations and leg swelling.  Gastrointestinal: Negative for heartburn, nausea, abdominal pain, diarrhea, constipation, blood in stool and melena.  Genitourinary: Negative for dysuria, urgency, frequency and hematuria.  Musculoskeletal: Negative for myalgias, back pain, joint pain and neck pain.  Skin: Negative for rash.  Neurological: Negative for dizziness, tingling, sensory change, focal weakness and headaches.  Endo/Heme/Allergies: Negative for environmental allergies and polydipsia. Does not bruise/bleed easily.  Psychiatric/Behavioral: Negative for depression and suicidal ideas. The patient is not nervous/anxious and does not have insomnia.      Objective  Filed Vitals:   11/12/15 0937  BP: 138/80  Pulse: 68  Temp: 98.3 F (36.8 C)  TempSrc: Oral  Height: 5\' 8"  (1.727 m)  Weight: 153 lb (69.4 kg)    Physical Exam  Constitutional: She is well-developed, well-nourished, and in no distress. No distress.  HENT:  Head: Normocephalic and atraumatic.  Right Ear: External ear normal.  Left Ear: External ear normal.  Nose: Nose normal.  Mouth/Throat: Oropharynx is clear and moist.  Eyes: Conjunctivae and EOM are normal. Pupils are equal, round, and reactive to light. Right eye exhibits no discharge. Left eye exhibits no discharge.  Neck: Normal range of motion. Neck supple. No JVD present. No thyromegaly present.  Cardiovascular: Normal rate, regular rhythm, normal heart sounds and intact  distal pulses.  Exam reveals no gallop and no friction rub.   No murmur heard. Pulmonary/Chest: Effort normal and breath sounds normal. She has no wheezes. She has no rales.  Abdominal: Soft. Bowel sounds are normal. She exhibits no mass. There is no tenderness. There is no guarding.  Musculoskeletal: Normal range of motion. She exhibits no edema.  Lymphadenopathy:    She has no cervical adenopathy.   Neurological: She is alert. She has normal reflexes.  Skin: Skin is warm and dry. She is not diaphoretic.  Psychiatric: Mood and affect normal.  Nursing note and vitals reviewed.     Assessment & Plan  Problem List Items Addressed This Visit    None    Visit Diagnoses    Acute maxillary sinusitis, recurrence not specified    -  Primary    Relevant Medications    amoxicillin (AMOXIL) 500 MG capsule    fluconazole (DIFLUCAN) 150 MG tablet         Dr. Deanna Jones [facility]  11/12/2015

## 2016-01-01 ENCOUNTER — Other Ambulatory Visit: Payer: Self-pay | Admitting: Internal Medicine

## 2016-01-01 DIAGNOSIS — R079 Chest pain, unspecified: Secondary | ICD-10-CM | POA: Diagnosis not present

## 2016-01-01 DIAGNOSIS — I10 Essential (primary) hypertension: Secondary | ICD-10-CM | POA: Diagnosis not present

## 2016-01-01 DIAGNOSIS — E782 Mixed hyperlipidemia: Secondary | ICD-10-CM | POA: Diagnosis not present

## 2016-01-01 DIAGNOSIS — Z1231 Encounter for screening mammogram for malignant neoplasm of breast: Secondary | ICD-10-CM

## 2016-01-01 DIAGNOSIS — Z0000 Encounter for general adult medical examination without abnormal findings: Secondary | ICD-10-CM | POA: Diagnosis not present

## 2016-01-01 DIAGNOSIS — E040 Nontoxic diffuse goiter: Secondary | ICD-10-CM | POA: Diagnosis not present

## 2016-01-01 DIAGNOSIS — M150 Primary generalized (osteo)arthritis: Secondary | ICD-10-CM | POA: Diagnosis not present

## 2016-01-01 DIAGNOSIS — Z0001 Encounter for general adult medical examination with abnormal findings: Secondary | ICD-10-CM | POA: Diagnosis not present

## 2016-01-03 DIAGNOSIS — E782 Mixed hyperlipidemia: Secondary | ICD-10-CM | POA: Insufficient documentation

## 2016-01-03 DIAGNOSIS — I208 Other forms of angina pectoris: Secondary | ICD-10-CM | POA: Diagnosis not present

## 2016-01-03 DIAGNOSIS — I10 Essential (primary) hypertension: Secondary | ICD-10-CM | POA: Diagnosis not present

## 2016-01-29 DIAGNOSIS — R072 Precordial pain: Secondary | ICD-10-CM | POA: Diagnosis not present

## 2016-01-29 DIAGNOSIS — I208 Other forms of angina pectoris: Secondary | ICD-10-CM | POA: Diagnosis not present

## 2016-01-29 DIAGNOSIS — E782 Mixed hyperlipidemia: Secondary | ICD-10-CM | POA: Diagnosis not present

## 2016-01-29 DIAGNOSIS — I10 Essential (primary) hypertension: Secondary | ICD-10-CM | POA: Diagnosis not present

## 2016-01-30 ENCOUNTER — Ambulatory Visit: Payer: No Typology Code available for payment source

## 2016-02-20 ENCOUNTER — Ambulatory Visit: Payer: No Typology Code available for payment source

## 2016-03-04 ENCOUNTER — Ambulatory Visit
Admission: RE | Admit: 2016-03-04 | Discharge: 2016-03-04 | Disposition: A | Discharge status: Home / Self Care | Payer: Medicare Other | Source: Ambulatory Visit | Attending: Internal Medicine | Admitting: Internal Medicine

## 2016-03-04 ENCOUNTER — Other Ambulatory Visit: Payer: Self-pay | Admitting: Internal Medicine

## 2016-03-04 DIAGNOSIS — D1801 Hemangioma of skin and subcutaneous tissue: Secondary | ICD-10-CM | POA: Diagnosis not present

## 2016-03-04 DIAGNOSIS — Z1231 Encounter for screening mammogram for malignant neoplasm of breast: Secondary | ICD-10-CM | POA: Diagnosis not present

## 2016-03-04 DIAGNOSIS — Z1283 Encounter for screening for malignant neoplasm of skin: Secondary | ICD-10-CM | POA: Diagnosis not present

## 2016-03-04 DIAGNOSIS — Z872 Personal history of diseases of the skin and subcutaneous tissue: Secondary | ICD-10-CM | POA: Diagnosis not present

## 2016-06-17 DIAGNOSIS — Z23 Encounter for immunization: Secondary | ICD-10-CM | POA: Diagnosis not present

## 2016-06-19 DIAGNOSIS — H25813 Combined forms of age-related cataract, bilateral: Secondary | ICD-10-CM | POA: Diagnosis not present

## 2016-06-30 DIAGNOSIS — Z23 Encounter for immunization: Secondary | ICD-10-CM | POA: Diagnosis not present

## 2017-01-20 DIAGNOSIS — I10 Essential (primary) hypertension: Secondary | ICD-10-CM | POA: Diagnosis not present

## 2017-01-20 DIAGNOSIS — E782 Mixed hyperlipidemia: Secondary | ICD-10-CM | POA: Diagnosis not present

## 2017-01-20 DIAGNOSIS — M1990 Unspecified osteoarthritis, unspecified site: Secondary | ICD-10-CM | POA: Diagnosis not present

## 2017-01-20 DIAGNOSIS — E288 Other ovarian dysfunction: Secondary | ICD-10-CM | POA: Diagnosis not present

## 2017-01-20 DIAGNOSIS — Z0001 Encounter for general adult medical examination with abnormal findings: Secondary | ICD-10-CM | POA: Diagnosis not present

## 2017-01-20 DIAGNOSIS — E040 Nontoxic diffuse goiter: Secondary | ICD-10-CM | POA: Diagnosis not present

## 2017-01-20 DIAGNOSIS — Z800 Family history of malignant neoplasm of digestive organs: Secondary | ICD-10-CM | POA: Diagnosis not present

## 2017-01-21 ENCOUNTER — Other Ambulatory Visit: Payer: Self-pay | Admitting: Internal Medicine

## 2017-01-21 DIAGNOSIS — Z1231 Encounter for screening mammogram for malignant neoplasm of breast: Secondary | ICD-10-CM

## 2017-03-24 DIAGNOSIS — D1800 Hemangioma unspecified site: Secondary | ICD-10-CM | POA: Diagnosis not present

## 2017-03-24 DIAGNOSIS — Z86018 Personal history of other benign neoplasm: Secondary | ICD-10-CM | POA: Diagnosis not present

## 2017-03-25 ENCOUNTER — Ambulatory Visit
Admission: RE | Admit: 2017-03-25 | Discharge: 2017-03-25 | Disposition: A | Discharge status: Home / Self Care | Payer: Medicare PPO | Source: Ambulatory Visit | Attending: Internal Medicine | Admitting: Internal Medicine

## 2017-03-25 DIAGNOSIS — Z1231 Encounter for screening mammogram for malignant neoplasm of breast: Secondary | ICD-10-CM | POA: Diagnosis not present

## 2017-04-21 ENCOUNTER — Other Ambulatory Visit: Payer: Self-pay | Admitting: Otolaryngology

## 2017-04-21 DIAGNOSIS — E041 Nontoxic single thyroid nodule: Secondary | ICD-10-CM

## 2017-04-27 ENCOUNTER — Ambulatory Visit
Admission: RE | Admit: 2017-04-27 | Discharge: 2017-04-27 | Disposition: A | Discharge status: Home / Self Care | Payer: Medicare PPO | Source: Ambulatory Visit | Attending: Otolaryngology | Admitting: Otolaryngology

## 2017-04-27 DIAGNOSIS — E041 Nontoxic single thyroid nodule: Secondary | ICD-10-CM | POA: Diagnosis present

## 2017-04-27 DIAGNOSIS — E042 Nontoxic multinodular goiter: Secondary | ICD-10-CM | POA: Insufficient documentation

## 2017-04-29 DIAGNOSIS — E041 Nontoxic single thyroid nodule: Secondary | ICD-10-CM | POA: Diagnosis not present

## 2017-04-30 ENCOUNTER — Other Ambulatory Visit: Payer: Self-pay | Admitting: Otolaryngology

## 2017-05-01 ENCOUNTER — Other Ambulatory Visit: Payer: Self-pay | Admitting: Otolaryngology

## 2017-05-01 DIAGNOSIS — E042 Nontoxic multinodular goiter: Secondary | ICD-10-CM

## 2017-05-07 ENCOUNTER — Ambulatory Visit: Payer: Medicare PPO

## 2017-06-11 DIAGNOSIS — H25813 Combined forms of age-related cataract, bilateral: Secondary | ICD-10-CM | POA: Diagnosis not present

## 2017-06-24 ENCOUNTER — Ambulatory Visit
Admission: RE | Admit: 2017-06-24 | Discharge: 2017-06-24 | Disposition: A | Discharge status: Home / Self Care | Payer: Medicare PPO | Source: Ambulatory Visit | Attending: Otolaryngology | Admitting: Otolaryngology

## 2017-06-24 DIAGNOSIS — E042 Nontoxic multinodular goiter: Secondary | ICD-10-CM | POA: Diagnosis not present

## 2017-06-24 NOTE — Procedures (Signed)
Pre Procedure Dx: thyroid nodules Post Procedural Dx: Same  Technically successful US guided biopsy of bilateral thyroid nodules.  EBL: None  No immediate complications.   Ronny Bacon, MD Pager #: [phone]

## 2017-06-25 LAB — CYTOLOGY - NON PAP

## 2017-12-06 DIAGNOSIS — I83891 Varicose veins of right lower extremities with other complications: Secondary | ICD-10-CM | POA: Diagnosis not present

## 2017-12-06 DIAGNOSIS — Z23 Encounter for immunization: Secondary | ICD-10-CM | POA: Diagnosis not present

## 2017-12-06 DIAGNOSIS — S81811A Laceration without foreign body, right lower leg, initial encounter: Secondary | ICD-10-CM | POA: Diagnosis not present

## 2017-12-06 DIAGNOSIS — R58 Hemorrhage, not elsewhere classified: Secondary | ICD-10-CM | POA: Diagnosis not present

## 2018-01-26 ENCOUNTER — Encounter: Payer: Self-pay | Admitting: Nurse Practitioner

## 2018-01-26 ENCOUNTER — Ambulatory Visit (INDEPENDENT_AMBULATORY_CARE_PROVIDER_SITE_OTHER): Payer: Medicare PPO | Admitting: Nurse Practitioner

## 2018-01-26 VITALS — BP 144/69 | HR 73 | Resp 16 | Ht 68.0 in | Wt 152.6 lb

## 2018-01-26 DIAGNOSIS — Z0001 Encounter for general adult medical examination with abnormal findings: Secondary | ICD-10-CM | POA: Diagnosis not present

## 2018-01-26 DIAGNOSIS — I10 Essential (primary) hypertension: Secondary | ICD-10-CM

## 2018-01-26 DIAGNOSIS — R300 Dysuria: Secondary | ICD-10-CM | POA: Diagnosis not present

## 2018-01-26 DIAGNOSIS — Z1231 Encounter for screening mammogram for malignant neoplasm of breast: Secondary | ICD-10-CM

## 2018-01-26 DIAGNOSIS — E559 Vitamin D deficiency, unspecified: Secondary | ICD-10-CM

## 2018-01-26 DIAGNOSIS — Z1239 Encounter for other screening for malignant neoplasm of breast: Secondary | ICD-10-CM

## 2018-01-26 NOTE — Progress Notes (Signed)
[facility] [place], [place] [address]  Internal MEDICINE  Office Visit Note  Patient Name: Cindy Duke  [id number]  [id number]  Date of Service: 02/15/2018   Pt is here for routine health maintenance examination  Chief Complaint  Patient presents with  . Annual Exam  . Hypertension     Hypertension  This is a chronic problem. The current episode started more than 1 year ago. The problem is unchanged. The problem is controlled. Associated symptoms include headaches. Pertinent negatives include no chest pain, neck pain, palpitations or shortness of breath. There are no associated agents to hypertension. Risk factors for coronary artery disease include dyslipidemia and post-menopausal state. Past treatments include angiotensin blockers. The current treatment provides moderate improvement. There are no compliance problems.      Current Medication: Outpatient Encounter Medications as of 01/26/2018  Medication Sig  . Calcium Carbonate-Vit D-Min (CALCIUM 1200 PO) Take 1 tablet by mouth daily.  Marland Kitchen losartan (COZAAR) 25 MG tablet Take 25 mg by mouth daily. Dr Chancy Milroy  . Multiple Vitamins-Minerals (MULTIVITAMIN WITH MINERALS) tablet Take 1 tablet by mouth daily.  Marland Kitchen amoxicillin (AMOXIL) 500 MG capsule Take 1 capsule (500 mg total) by mouth 3 (three) times daily. (Patient not taking: Reported on 06/24/2017)  . BIOTIN PO Take 1 capsule by mouth daily.  . fluconazole (DIFLUCAN) 150 MG tablet Take 1 tablet (150 mg total) by mouth once. (Patient not taking: Reported on 06/24/2017)  . meloxicam (MOBIC) 7.5 MG tablet Take 7.5 mg by mouth 3 (three) times a week. Dr Chancy Milroy   No facility-administered encounter medications on file as of 01/26/2018.     Surgical History: Past Surgical History:  Procedure Laterality Date  . BREAST BIOPSY Left    bx/clip-neg  . COLONOSCOPY  2016   Dr Vira Agar- cleared for 5 yrs  . VAGINAL HYSTERECTOMY    . VAGINAL PROLAPSE REPAIR       Medical History: History reviewed. No pertinent past medical history.  Family History: Family History  Problem Relation Age of Onset  . Breast cancer Neg Hx       Review of Systems  Constitutional: Negative for activity change, chills, fatigue and unexpected weight change.  HENT: Negative for congestion, postnasal drip, rhinorrhea, sneezing and sore throat.   Eyes: Negative.  Negative for redness.  Respiratory: Negative for cough, chest tightness, shortness of breath and wheezing.   Cardiovascular: Negative for chest pain and palpitations.  Gastrointestinal: Negative for abdominal pain, constipation, diarrhea, nausea and vomiting.  Endocrine: Negative for cold intolerance, heat intolerance, polydipsia, polyphagia and polyuria.  Genitourinary: Negative.  Negative for dysuria and frequency.  Musculoskeletal: Negative for arthralgias, back pain, joint swelling, neck pain and neck stiffness.  Skin: Negative for rash.  Allergic/Immunologic: Negative for environmental allergies.  Neurological: Positive for headaches. Negative for dizziness, tremors and numbness.  Hematological: Negative for adenopathy. Does not bruise/bleed easily.  Psychiatric/Behavioral: Negative for behavioral problems (Depression), sleep disturbance and suicidal ideas. The patient is not nervous/anxious.      Today's Vitals   01/26/18 1036  BP: (!) 144/69  Pulse: 73  Resp: 16  SpO2: 98%  Weight: 152 lb 9.6 oz (69.2 kg)  Height: 5\' 8"  (1.727 m)    Physical Exam  Constitutional: She is oriented to person, place, and time. She appears well-developed and well-nourished. No distress.  HENT:  Head: Normocephalic and atraumatic.  Nose: Nose normal.  Mouth/Throat: Oropharynx is clear and moist. No oropharyngeal exudate.  Eyes: Pupils are  equal, round, and reactive to light. Conjunctivae and EOM are normal.  Neck: Normal range of motion. Neck supple. No JVD present. Carotid bruit is not present. No tracheal  deviation present. No thyromegaly present.  Cardiovascular: Normal rate, regular rhythm, normal heart sounds and intact distal pulses. Exam reveals no gallop and no friction rub.  No murmur heard. Pulmonary/Chest: Effort normal and breath sounds normal. No respiratory distress. She has no wheezes. She has no rales. She exhibits no tenderness. Right breast exhibits no inverted nipple, no mass, no nipple discharge, no skin change and no tenderness. Left breast exhibits no inverted nipple, no mass, no nipple discharge, no skin change and no tenderness.  Abdominal: Soft. Bowel sounds are normal.  Musculoskeletal: Normal range of motion.  Lymphadenopathy:    She has no cervical adenopathy.  Neurological: She is alert and oriented to person, place, and time. No cranial nerve deficit.  Skin: Skin is warm and dry. She is not diaphoretic.  Psychiatric: She has a normal mood and affect. Her behavior is normal. Judgment and thought content normal.  Nursing note and vitals reviewed.    LABS: Recent Results (from the past 2160 hour(s))  Urinalysis, Routine w reflex microscopic     Status: None   Collection Time: 01/26/18  2:47 PM  Result Value Ref Range   Specific Gravity, UA 1.020 1.005 - 1.030   pH, UA 5.5 5.0 - 7.5   Color, UA Yellow Yellow   Appearance Ur Clear Clear   Leukocytes, UA Negative Negative   Protein, UA Negative Negative/Trace   Glucose, UA Negative Negative   Ketones, UA Negative Negative   RBC, UA Negative Negative   Bilirubin, UA Negative Negative   Urobilinogen, Ur 0.2 0.2 - 1.0 mg/dL   Nitrite, UA Negative Negative   Microscopic Examination Comment     Comment: Microscopic not indicated and not performed.  Comprehensive metabolic panel     Status: Abnormal   Collection Time: 01/27/18  8:47 AM  Result Value Ref Range   Glucose 81 65 - 99 mg/dL   BUN 14 8 - 27 mg/dL   Creatinine, Ser 0.66 0.57 - 1.00 mg/dL   GFR calc non Af Amer 88 >59 mL/min/1.73   GFR calc Af Amer 101  >59 mL/min/1.73   BUN/Creatinine Ratio 21 12 - 28   Sodium 144 134 - 144 mmol/L   Potassium 4.5 3.5 - 5.2 mmol/L   Chloride 108 (H) 96 - 106 mmol/L   CO2 22 20 - 29 mmol/L   Calcium 10.2 8.7 - 10.3 mg/dL   Total Protein 6.5 6.0 - 8.5 g/dL   Albumin 4.5 3.5 - 4.8 g/dL   Globulin, Total 2.0 1.5 - 4.5 g/dL   Albumin/Globulin Ratio 2.3 (H) 1.2 - 2.2   Bilirubin Total 0.3 0.0 - 1.2 mg/dL   Alkaline Phosphatase 92 39 - 117 IU/L   AST 14 0 - 40 IU/L   ALT 17 0 - 32 IU/L  CBC     Status: None   Collection Time: 01/27/18  8:47 AM  Result Value Ref Range   WBC 4.6 3.4 - 10.8 x10E3/uL   RBC 4.47 3.77 - 5.28 x10E6/uL   Hemoglobin 13.6 11.1 - 15.9 g/dL   Hematocrit 40.2 34.0 - 46.6 %   MCV 90 79 - 97 fL   MCH 30.4 26.6 - 33.0 pg   MCHC 33.8 31.5 - 35.7 g/dL   RDW 13.3 12.3 - 15.4 %   Platelets 210 150 - 450  x10E3/uL  Lipid Panel w/o Chol/HDL Ratio     Status: Abnormal   Collection Time: 01/27/18  8:47 AM  Result Value Ref Range   Cholesterol, Total 185 100 - 199 mg/dL   Triglycerides 103 0 - 149 mg/dL   HDL 47 >39 mg/dL   VLDL Cholesterol Cal 21 5 - 40 mg/dL   LDL Calculated 117 (H) 0 - 99 mg/dL  T4, free     Status: None   Collection Time: 01/27/18  8:47 AM  Result Value Ref Range   Free T4 0.99 0.82 - 1.77 ng/dL  TSH     Status: None   Collection Time: 01/27/18  8:47 AM  Result Value Ref Range   TSH 1.500 0.450 - 4.500 uIU/mL  VITAMIN D 25 Hydroxy (Vit-D Deficiency, Fractures)     Status: None   Collection Time: 01/27/18  8:47 AM  Result Value Ref Range   Vit D, 25-Hydroxy 45.5 30.0 - 100.0 ng/mL    Comment: Vitamin D deficiency has been defined by the [place] and an Endocrine Society practice guideline as a level of serum 25-OH vitamin D less than 20 ng/mL (1,2). The Endocrine Society went on to further define vitamin D insufficiency as a level between 21 and 29 ng/mL (2). 1. IOM (Institute of Medicine). 2010. Dietary reference    intakes for calcium and D.  [place]: The    [employer]. 2. Holick MF, Binkley [place], Bischoff-Ferrari HA, et al.    Evaluation, treatment, and prevention of vitamin D    deficiency: an Endocrine Society clinical practice    guideline. JCEM. 2011 Jul; 96(7):1911-30.     Assessment/Plan: 1. Encounter for general adult medical examination with abnormal findings Annual health maintenance exam performed today.  - CBC with Differential/Platelet; Future - Comprehensive metabolic panel; Future - T4, free; Future - TSH; Future - Lipid panel; Future  2. Essential hypertension cotinue losartan as prescribed.  - CBC with Differential/Platelet; Future - Comprehensive metabolic panel; Future - T4, free; Future - TSH; Future - Lipid panel; Future  3. Vitamin D deficiency - Vitamin D 1,25 dihydroxy; Future  4. Dysuria - Urinalysis, Routine w reflex microscopic  5. Screening for breast cancer - MM DIGITAL SCREENING BILATERAL; Future  General Counseling: harlei lehrmann understanding of the findings of todays visit and agrees with plan of treatment. I have discussed any further diagnostic evaluation that may be needed or ordered today. We also reviewed her medications today. she has been encouraged to call the office with any questions or concerns that should arise related to todays visit.    Counseling:  Hypertension Counseling:   The following hypertensive lifestyle modification were recommended and discussed:  1. Limiting alcohol intake to less than 1 oz/day of ethanol:(24 oz of beer or 8 oz of wine or 2 oz of 100-proof whiskey). 2. Take baby ASA 81 mg daily. 3. Importance of regular aerobic exercise and losing weight. 4. Reduce dietary saturated fat and cholesterol intake for overall cardiovascular health. 5. Maintaining adequate dietary potassium, calcium, and magnesium intake. 6. Regular monitoring of the blood pressure. 7. Reduce sodium intake to less than 100 mmol/day (less than 2.3  gm of sodium or less than 6 gm of sodium choride)   This patient was seen by Leretha Pol, FNP- C in Collaboration with Dr Lavera Guise as a part of collaborative care agreement   Orders Placed This Encounter  Procedures  . MM DIGITAL SCREENING BILATERAL  . Urinalysis, Routine  w reflex microscopic  . CBC with Differential/Platelet  . Comprehensive metabolic panel  . T4, free  . TSH  . Lipid panel  . Vitamin D 1,25 dihydroxy      Time spent: Lolo, MD  Internal Medicine

## 2018-01-27 ENCOUNTER — Other Ambulatory Visit: Payer: Self-pay | Admitting: Nurse Practitioner

## 2018-01-27 DIAGNOSIS — Z0001 Encounter for general adult medical examination with abnormal findings: Secondary | ICD-10-CM | POA: Diagnosis not present

## 2018-01-27 DIAGNOSIS — I10 Essential (primary) hypertension: Secondary | ICD-10-CM | POA: Diagnosis not present

## 2018-01-27 DIAGNOSIS — E559 Vitamin D deficiency, unspecified: Secondary | ICD-10-CM | POA: Diagnosis not present

## 2018-01-27 LAB — URINALYSIS, ROUTINE W REFLEX MICROSCOPIC
BILIRUBIN UA: NEGATIVE
Glucose, UA: NEGATIVE
KETONES UA: NEGATIVE
Leukocytes, UA: NEGATIVE
NITRITE UA: NEGATIVE
PH UA: 5.5 (ref 5.0–7.5)
Protein, UA: NEGATIVE
RBC, UA: NEGATIVE
Specific Gravity, UA: 1.02 (ref 1.005–1.030)
UUROB: 0.2 mg/dL (ref 0.2–1.0)

## 2018-01-28 LAB — CBC
HEMOGLOBIN: 13.6 g/dL (ref 11.1–15.9)
Hematocrit: 40.2 % (ref 34.0–46.6)
MCH: 30.4 pg (ref 26.6–33.0)
MCHC: 33.8 g/dL (ref 31.5–35.7)
MCV: 90 fL (ref 79–97)
Platelets: 210 10*3/uL (ref 150–450)
RBC: 4.47 x10E6/uL (ref 3.77–5.28)
RDW: 13.3 % (ref 12.3–15.4)
WBC: 4.6 10*3/uL (ref 3.4–10.8)

## 2018-01-28 LAB — LIPID PANEL W/O CHOL/HDL RATIO
Cholesterol, Total: 185 mg/dL (ref 100–199)
HDL: 47 mg/dL (ref >39)
LDL CALC: 117 mg/dL — AB (ref 0–99)
Triglycerides: 103 mg/dL (ref 0–149)
VLDL CHOLESTEROL CAL: 21 mg/dL (ref 5–40)

## 2018-01-28 LAB — COMPREHENSIVE METABOLIC PANEL
ALK PHOS: 92 IU/L (ref 39–117)
ALT: 17 IU/L (ref 0–32)
AST: 14 IU/L (ref 0–40)
Albumin/Globulin Ratio: 2.3 — ABNORMAL HIGH (ref 1.2–2.2)
Albumin: 4.5 g/dL (ref 3.5–4.8)
BUN/Creatinine Ratio: 21 (ref 12–28)
BUN: 14 mg/dL (ref 8–27)
Bilirubin Total: 0.3 mg/dL (ref 0.0–1.2)
CO2: 22 mmol/L (ref 20–29)
CREATININE: 0.66 mg/dL (ref 0.57–1.00)
Calcium: 10.2 mg/dL (ref 8.7–10.3)
Chloride: 108 mmol/L — ABNORMAL HIGH (ref 96–106)
GFR calc Af Amer: 101 mL/min/{1.73_m2} (ref >59)
GFR calc non Af Amer: 88 mL/min/{1.73_m2} (ref >59)
GLOBULIN, TOTAL: 2 g/dL (ref 1.5–4.5)
Glucose: 81 mg/dL (ref 65–99)
Potassium: 4.5 mmol/L (ref 3.5–5.2)
SODIUM: 144 mmol/L (ref 134–144)
Total Protein: 6.5 g/dL (ref 6.0–8.5)

## 2018-01-28 LAB — VITAMIN D 25 HYDROXY (VIT D DEFICIENCY, FRACTURES): Vit D, 25-Hydroxy: 45.5 ng/mL (ref 30.0–100.0)

## 2018-01-28 LAB — T4, FREE: Free T4: 0.99 ng/dL (ref 0.82–1.77)

## 2018-01-28 LAB — TSH: TSH: 1.5 u[IU]/mL (ref 0.450–4.500)

## 2018-02-15 DIAGNOSIS — I10 Essential (primary) hypertension: Secondary | ICD-10-CM | POA: Insufficient documentation

## 2018-02-15 DIAGNOSIS — E559 Vitamin D deficiency, unspecified: Secondary | ICD-10-CM | POA: Insufficient documentation

## 2018-02-15 DIAGNOSIS — Z1239 Encounter for other screening for malignant neoplasm of breast: Secondary | ICD-10-CM | POA: Insufficient documentation

## 2018-02-15 DIAGNOSIS — R300 Dysuria: Secondary | ICD-10-CM | POA: Insufficient documentation

## 2018-03-25 DIAGNOSIS — Z1283 Encounter for screening for malignant neoplasm of skin: Secondary | ICD-10-CM | POA: Diagnosis not present

## 2018-03-25 DIAGNOSIS — Z86018 Personal history of other benign neoplasm: Secondary | ICD-10-CM | POA: Diagnosis not present

## 2018-03-25 DIAGNOSIS — L578 Other skin changes due to chronic exposure to nonionizing radiation: Secondary | ICD-10-CM | POA: Diagnosis not present

## 2018-03-29 ENCOUNTER — Ambulatory Visit
Admission: RE | Admit: 2018-03-29 | Discharge: 2018-03-29 | Disposition: A | Discharge status: Home / Self Care | Payer: Medicare PPO | Source: Ambulatory Visit | Attending: Nurse Practitioner | Admitting: Nurse Practitioner

## 2018-03-29 DIAGNOSIS — Z1231 Encounter for screening mammogram for malignant neoplasm of breast: Secondary | ICD-10-CM | POA: Insufficient documentation

## 2018-03-29 DIAGNOSIS — Z1239 Encounter for other screening for malignant neoplasm of breast: Secondary | ICD-10-CM

## 2018-04-01 ENCOUNTER — Other Ambulatory Visit: Payer: Self-pay

## 2018-04-01 MED ORDER — LOSARTAN POTASSIUM 25 MG PO TABS: 25.0000 mg | ORAL_TABLET | Freq: Every day | ORAL | 1 refills | Status: DC

## 2018-04-22 DIAGNOSIS — I83813 Varicose veins of bilateral lower extremities with pain: Secondary | ICD-10-CM | POA: Diagnosis not present

## 2018-04-22 DIAGNOSIS — I8311 Varicose veins of right lower extremity with inflammation: Secondary | ICD-10-CM | POA: Diagnosis not present

## 2018-04-22 DIAGNOSIS — I8312 Varicose veins of left lower extremity with inflammation: Secondary | ICD-10-CM | POA: Diagnosis not present

## 2018-06-21 DIAGNOSIS — I83813 Varicose veins of bilateral lower extremities with pain: Secondary | ICD-10-CM | POA: Diagnosis not present

## 2018-06-21 DIAGNOSIS — I8311 Varicose veins of right lower extremity with inflammation: Secondary | ICD-10-CM | POA: Diagnosis not present

## 2018-06-21 DIAGNOSIS — I8312 Varicose veins of left lower extremity with inflammation: Secondary | ICD-10-CM | POA: Diagnosis not present

## 2018-08-18 DIAGNOSIS — I8311 Varicose veins of right lower extremity with inflammation: Secondary | ICD-10-CM | POA: Diagnosis not present

## 2018-08-18 DIAGNOSIS — I83891 Varicose veins of right lower extremities with other complications: Secondary | ICD-10-CM | POA: Diagnosis not present

## 2018-08-31 DIAGNOSIS — E041 Nontoxic single thyroid nodule: Secondary | ICD-10-CM | POA: Diagnosis not present

## 2018-09-17 DIAGNOSIS — R58 Hemorrhage, not elsewhere classified: Secondary | ICD-10-CM | POA: Diagnosis not present

## 2018-09-17 DIAGNOSIS — I8311 Varicose veins of right lower extremity with inflammation: Secondary | ICD-10-CM | POA: Diagnosis not present

## 2018-09-17 DIAGNOSIS — I83811 Varicose veins of right lower extremities with pain: Secondary | ICD-10-CM | POA: Diagnosis not present

## 2018-10-06 DIAGNOSIS — I83891 Varicose veins of right lower extremities with other complications: Secondary | ICD-10-CM | POA: Diagnosis not present

## 2018-10-06 DIAGNOSIS — I8311 Varicose veins of right lower extremity with inflammation: Secondary | ICD-10-CM | POA: Diagnosis not present

## 2018-10-11 ENCOUNTER — Other Ambulatory Visit: Payer: Self-pay

## 2018-10-11 MED ORDER — LOSARTAN POTASSIUM 25 MG PO TABS: 25.0000 mg | ORAL_TABLET | Freq: Every day | ORAL | 1 refills | Status: DC

## 2019-01-24 DIAGNOSIS — I83892 Varicose veins of left lower extremities with other complications: Secondary | ICD-10-CM | POA: Diagnosis not present

## 2019-01-24 DIAGNOSIS — I8312 Varicose veins of left lower extremity with inflammation: Secondary | ICD-10-CM | POA: Diagnosis not present

## 2019-02-01 ENCOUNTER — Ambulatory Visit (INDEPENDENT_AMBULATORY_CARE_PROVIDER_SITE_OTHER): Payer: Medicare PPO | Admitting: Internal Medicine

## 2019-02-01 ENCOUNTER — Other Ambulatory Visit: Payer: Self-pay

## 2019-02-01 ENCOUNTER — Encounter: Payer: Self-pay | Admitting: Internal Medicine

## 2019-02-01 VITALS — BP 134/78 | HR 74 | Resp 16 | Ht 68.0 in | Wt 155.0 lb

## 2019-02-01 DIAGNOSIS — Z0001 Encounter for general adult medical examination with abnormal findings: Secondary | ICD-10-CM | POA: Diagnosis not present

## 2019-02-01 DIAGNOSIS — I10 Essential (primary) hypertension: Secondary | ICD-10-CM | POA: Diagnosis not present

## 2019-02-01 DIAGNOSIS — K5909 Other constipation: Secondary | ICD-10-CM | POA: Diagnosis not present

## 2019-02-01 DIAGNOSIS — R300 Dysuria: Secondary | ICD-10-CM | POA: Diagnosis not present

## 2019-02-01 DIAGNOSIS — E2839 Other primary ovarian failure: Secondary | ICD-10-CM

## 2019-02-01 DIAGNOSIS — N809 Endometriosis, unspecified: Secondary | ICD-10-CM | POA: Insufficient documentation

## 2019-02-01 DIAGNOSIS — Z1239 Encounter for other screening for malignant neoplasm of breast: Secondary | ICD-10-CM | POA: Diagnosis not present

## 2019-02-01 MED ORDER — LOSARTAN POTASSIUM 25 MG PO TABS: ORAL_TABLET | ORAL | 3 refills | Status: DC

## 2019-02-01 NOTE — Progress Notes (Signed)
Blood pressure elevated, taken twice  1st reading 146/80 2nd reading 134/78 Informed provider

## 2019-02-01 NOTE — Progress Notes (Signed)
[facility] [place], [place] [address]  Internal MEDICINE  Office Visit Note  Patient Name: Cindy Duke  [id number]  [id number]  Date of Service: 02/01/2019  Chief Complaint  Patient presents with  . Medicare Wellness    medication refills     HPI Pt is here for routine health maintenance examination. Pt is to her baseline, denies any major problems, continues to have sleep problems however does not want any prescription meds. BP seems to be under better control, she will like to have her refill on Losartan. Pt is due for her mammogram and bone density scan, she does have strong family hx of colon cancer; her last colonoscopy was 5 years ago. Pt was instructed by her GI to repeat in 5 years. Pt does complain of arthritic symptoms, takes Tylenol as needed. Denies chest pain, SOB, nausea, vomiting, diarrhea.   Current Medication: Outpatient Encounter Medications as of 02/01/2019  Medication Sig  . Calcium Carbonate-Vit D-Min (CALCIUM 1200 Duke) Take 1 tablet by mouth daily.  Marland Kitchen losartan (COZAAR) 25 MG tablet Take one tab for bp in am  . Multiple Vitamins-Minerals (MULTIVITAMIN WITH MINERALS) tablet Take 1 tablet by mouth daily.  . [DISCONTINUED] losartan (COZAAR) 25 MG tablet Take 1 tablet (25 mg total) by mouth daily. Cindy Duke  . [DISCONTINUED] amoxicillin (AMOXIL) 500 MG capsule Take 1 capsule (500 mg total) by mouth 3 (three) times daily. (Patient not taking: Reported on 06/24/2017)  . [DISCONTINUED] Cindy Duke Take 1 capsule by mouth daily.  . [DISCONTINUED] Cindy (DIFLUCAN) 150 MG tablet Take 1 tablet (150 mg total) by mouth once. (Patient not taking: Reported on 06/24/2017)  . [DISCONTINUED] Cindy (MOBIC) 7.5 MG tablet Take 7.5 mg by mouth 3 (three) times a week. Cindy Duke   No facility-administered encounter medications on file as of 02/01/2019.     Surgical History: Past Surgical History:  Procedure Laterality Date  . BREAST  BIOPSY Left    bx/clip-neg  . COLONOSCOPY  2016   Cindy Duke- cleared for 5 yrs  . VAGINAL HYSTERECTOMY    . VAGINAL PROLAPSE REPAIR      Medical History: History reviewed. No pertinent past medical history.  Family History: Family History  Problem Relation Age of Onset  . Breast cancer Neg Hx     Review of Systems  Constitutional: Negative for chills, diaphoresis and fatigue.  HENT: Negative for ear pain, postnasal drip and sinus pressure.   Eyes: Negative for photophobia, discharge, redness, itching and visual disturbance.  Respiratory: Negative for cough, shortness of breath and wheezing.   Cardiovascular: Negative for chest pain, palpitations and leg swelling.  Gastrointestinal: Positive for constipation. Negative for abdominal pain, diarrhea, nausea and vomiting.  Genitourinary: Negative for dysuria and flank pain.  Musculoskeletal: Positive for arthralgias. Negative for back pain, gait problem and neck pain.  Skin: Negative for color change.  Allergic/Immunologic: Negative for environmental allergies and food allergies.  Neurological: Negative for dizziness and headaches.  Hematological: Does not bruise/bleed easily.  Psychiatric/Behavioral: Positive for sleep disturbance. Negative for agitation, behavioral problems (depression) and hallucinations.   Vital Signs: BP 134/78 (BP Location: Left Arm, Patient Position: Sitting, Cuff Size: Normal) Comment: 1st reading 146/80  Pulse 74   Resp 16   Ht 5\' 8"  (1.727 m)   Wt 155 lb (70.3 kg)   SpO2 98%   BMI 23.57 kg/m   Physical Exam Constitutional:      General: She is not in acute distress.  Appearance: She is well-developed. She is not diaphoretic.  HENT:     Head: Normocephalic and atraumatic.     Mouth/Throat:     Pharynx: No oropharyngeal exudate.  Eyes:     Pupils: Pupils are equal, round, and reactive to light.  Neck:     Musculoskeletal: Normal range of motion and neck supple.     Thyroid: No thyromegaly.      Vascular: No JVD.     Trachea: No tracheal deviation.  Cardiovascular:     Rate and Rhythm: Normal rate and regular rhythm.     Heart sounds: Normal heart sounds. No murmur. No friction rub. No gallop.   Pulmonary:     Effort: Pulmonary effort is normal. No respiratory distress.     Breath sounds: No wheezing or rales.  Chest:     Chest wall: No tenderness.     Breasts:        Right: Normal. No nipple discharge.        Left: Normal. No nipple discharge.  Abdominal:     General: Bowel sounds are normal.     Palpations: Abdomen is soft.  Musculoskeletal: Normal range of motion.  Lymphadenopathy:     Cervical: No cervical adenopathy.  Skin:    General: Skin is warm and dry.  Neurological:     Mental Status: She is alert and oriented to person, place, and time.     Cranial Nerves: No cranial nerve deficit.  Psychiatric:        Behavior: Behavior normal.        Thought Content: Thought content normal.        Judgment: Judgment normal.    Assessment/Plan: 1. Encounter for general adult medical examination with abnormal findings - Pt is up to date on her health maintenance exam, message was sent to GI for a repeat colonoscopy  - Pt had normal labs, will wait until next visit to repeat  2. Essential hypertension - BP is under good control, continue as before - losartan (COZAAR) 25 MG tablet; Take one tab for bp in am  Dispense: 90 tablet; Refill: 3  3. Screening for breast cancer - MM DIGITAL SCREENING BILATERAL; Future  4. Chronic constipation - Pt takes Miralax as needed  5. Other primary ovarian failure - DG Bone Density; Future - Encouraged calcium and Vitamin D intake  6. Dysuria - UA/M w/rflx Culture, Routine  General Counseling: Cindy Duke understanding of the findings of todays visit and agrees with plan of treatment. I have discussed any further diagnostic evaluation that may be needed or ordered today. We also reviewed her medications today. she has  been encouraged to call the office with any questions or concerns that should arise related to todays visit.  Cardiac risk factor modification:  1. Control blood pressure. 2. Exercise as prescribed. 3. Follow low sodium, low fat diet. and low fat and low cholestrol diet. 4. Take ASA 81mg  once a day. 5. Restricted calories diet to lose weight.   Orders Placed This Encounter  Procedures  . MM DIGITAL SCREENING BILATERAL  . DG Bone Density  . UA/M w/rflx Culture, Routine    Meds ordered this encounter  Medications  . losartan (COZAAR) 25 MG tablet    Sig: Take one tab for bp in am    Dispense:  90 tablet    Refill:  3    Time spent: [place], MD  Internal Medicine

## 2019-02-04 LAB — UA/M W/RFLX CULTURE, ROUTINE
Bilirubin, UA: NEGATIVE
Glucose, UA: NEGATIVE
Ketones, UA: NEGATIVE
Nitrite, UA: NEGATIVE
Protein,UA: NEGATIVE
RBC, UA: NEGATIVE
Specific Gravity, UA: 1.02 (ref 1.005–1.030)
Urobilinogen, Ur: 0.2 mg/dL (ref 0.2–1.0)
pH, UA: 5 (ref 5.0–7.5)

## 2019-02-04 LAB — MICROSCOPIC EXAMINATION
Bacteria, UA: NONE SEEN
Casts: NONE SEEN /lpf

## 2019-02-04 LAB — URINE CULTURE, REFLEX

## 2019-02-10 ENCOUNTER — Telehealth: Payer: Self-pay

## 2019-02-10 ENCOUNTER — Other Ambulatory Visit: Payer: Self-pay

## 2019-02-10 MED ORDER — CIPROFLOXACIN HCL 500 MG PO TABS: 500.0000 mg | ORAL_TABLET | Freq: Two times a day (BID) | ORAL | 0 refills | Status: AC

## 2019-02-10 NOTE — Telephone Encounter (Signed)
Pt advised urine did showed UTI we send cipro for 5 days to phar and drink plenty of water

## 2019-02-10 NOTE — Telephone Encounter (Signed)
-----   Message from Lavera Guise, MD sent at 02/10/2019 11:05 AM EDT ----- Pt has a mild UTI (bladder infection) call in cipro 500 mg po bid x 5 days # 10. Please advise her to drink more water as well

## 2019-02-21 DIAGNOSIS — I83892 Varicose veins of left lower extremities with other complications: Secondary | ICD-10-CM | POA: Diagnosis not present

## 2019-02-21 DIAGNOSIS — M7981 Nontraumatic hematoma of soft tissue: Secondary | ICD-10-CM | POA: Diagnosis not present

## 2019-02-21 DIAGNOSIS — I8312 Varicose veins of left lower extremity with inflammation: Secondary | ICD-10-CM | POA: Diagnosis not present

## 2019-03-07 DIAGNOSIS — I8311 Varicose veins of right lower extremity with inflammation: Secondary | ICD-10-CM | POA: Diagnosis not present

## 2019-03-07 DIAGNOSIS — I83891 Varicose veins of right lower extremities with other complications: Secondary | ICD-10-CM | POA: Diagnosis not present

## 2019-03-07 DIAGNOSIS — M7981 Nontraumatic hematoma of soft tissue: Secondary | ICD-10-CM | POA: Diagnosis not present

## 2019-03-31 ENCOUNTER — Ambulatory Visit: Payer: Medicare PPO

## 2019-03-31 ENCOUNTER — Inpatient Hospital Stay: Admission: RE | Admit: 2019-03-31 | Payer: Medicare PPO | Source: Ambulatory Visit

## 2019-04-08 DIAGNOSIS — I8312 Varicose veins of left lower extremity with inflammation: Secondary | ICD-10-CM | POA: Diagnosis not present

## 2019-04-08 DIAGNOSIS — I83812 Varicose veins of left lower extremities with pain: Secondary | ICD-10-CM | POA: Diagnosis not present

## 2019-04-12 DIAGNOSIS — H25813 Combined forms of age-related cataract, bilateral: Secondary | ICD-10-CM | POA: Diagnosis not present

## 2019-04-26 ENCOUNTER — Other Ambulatory Visit: Payer: Self-pay

## 2019-04-26 ENCOUNTER — Ambulatory Visit
Admission: RE | Admit: 2019-04-26 | Discharge: 2019-04-26 | Disposition: A | Discharge status: Home / Self Care | Payer: Medicare PPO | Source: Ambulatory Visit | Attending: Internal Medicine | Admitting: Internal Medicine

## 2019-04-26 DIAGNOSIS — Z13820 Encounter for screening for osteoporosis: Secondary | ICD-10-CM | POA: Insufficient documentation

## 2019-04-26 DIAGNOSIS — M8580 Other specified disorders of bone density and structure, unspecified site: Secondary | ICD-10-CM | POA: Insufficient documentation

## 2019-04-26 DIAGNOSIS — Z1239 Encounter for other screening for malignant neoplasm of breast: Secondary | ICD-10-CM

## 2019-04-26 DIAGNOSIS — Z1231 Encounter for screening mammogram for malignant neoplasm of breast: Secondary | ICD-10-CM | POA: Diagnosis not present

## 2019-04-26 DIAGNOSIS — E2839 Other primary ovarian failure: Secondary | ICD-10-CM | POA: Insufficient documentation

## 2019-04-26 DIAGNOSIS — Z780 Asymptomatic menopausal state: Secondary | ICD-10-CM | POA: Insufficient documentation

## 2019-04-26 DIAGNOSIS — M85852 Other specified disorders of bone density and structure, left thigh: Secondary | ICD-10-CM | POA: Diagnosis not present

## 2019-05-09 ENCOUNTER — Telehealth: Payer: Self-pay

## 2019-05-09 NOTE — Telephone Encounter (Signed)
lmom to call us back

## 2019-05-12 ENCOUNTER — Telehealth: Payer: Self-pay

## 2019-05-12 NOTE — Telephone Encounter (Signed)
Called pt several times to notify her of her bmd test per Dr. Clayborn Bigness and that pt continues to do well, slight bone loss, make sure to take calcium and vit D. also that we will send her information on osteoporosis.  Pt has not answered, so I will be mailing her this information.

## 2019-06-27 DIAGNOSIS — Z86018 Personal history of other benign neoplasm: Secondary | ICD-10-CM | POA: Diagnosis not present

## 2019-06-27 DIAGNOSIS — L578 Other skin changes due to chronic exposure to nonionizing radiation: Secondary | ICD-10-CM | POA: Diagnosis not present

## 2020-02-07 ENCOUNTER — Ambulatory Visit: Payer: Medicare PPO | Admitting: Internal Medicine

## 2020-02-17 ENCOUNTER — Telehealth: Payer: Self-pay

## 2020-02-17 NOTE — Telephone Encounter (Signed)
Lmom to confirm and screen for 02-21-20 ov.

## 2020-02-21 ENCOUNTER — Other Ambulatory Visit: Payer: Self-pay

## 2020-02-21 ENCOUNTER — Ambulatory Visit (INDEPENDENT_AMBULATORY_CARE_PROVIDER_SITE_OTHER): Payer: Medicare PPO | Admitting: Internal Medicine

## 2020-02-21 DIAGNOSIS — I495 Sick sinus syndrome: Secondary | ICD-10-CM

## 2020-02-21 DIAGNOSIS — E7849 Other hyperlipidemia: Secondary | ICD-10-CM | POA: Diagnosis not present

## 2020-02-21 DIAGNOSIS — H6121 Impacted cerumen, right ear: Secondary | ICD-10-CM

## 2020-02-21 DIAGNOSIS — Z0001 Encounter for general adult medical examination with abnormal findings: Secondary | ICD-10-CM

## 2020-02-21 DIAGNOSIS — Z1231 Encounter for screening mammogram for malignant neoplasm of breast: Secondary | ICD-10-CM

## 2020-02-21 DIAGNOSIS — R300 Dysuria: Secondary | ICD-10-CM

## 2020-02-21 DIAGNOSIS — Z1211 Encounter for screening for malignant neoplasm of colon: Secondary | ICD-10-CM | POA: Diagnosis not present

## 2020-02-21 NOTE — Progress Notes (Signed)
[facility] [place], [place] [address]  Internal MEDICINE  Office Visit Note  Patient Name: Cindy Duke  [id number]  [id number]  Date of Service: 03/12/2020  Chief Complaint  Patient presents with  . Annual Exam    add referal for colonoscopy  . Quality Metric Gaps    hep C screening   HPI Pt is here for routine health maintenance examination. She feels well, no problems, she is very active in her yard, no problem with STM, has not been a good sleeper. Will need colonoscopy due to fhx of colon cancer. Has seen cardiology in the past. No fever or chest pain  Current Medication: Outpatient Encounter Medications as of 02/21/2020  Medication Sig  . Calcium Carbonate-Vit D-Min (CALCIUM 1200 PO) Take 1 tablet by mouth daily.  Marland Kitchen losartan (COZAAR) 25 MG tablet Take one tab for bp in am  . Multiple Vitamins-Minerals (MULTIVITAMIN WITH MINERALS) tablet Take 1 tablet by mouth daily.   No facility-administered encounter medications on file as of 02/21/2020.    Surgical History: Past Surgical History:  Procedure Laterality Date  . BREAST BIOPSY Left    bx/clip-neg  . COLONOSCOPY  2016   Dr Vira Agar- cleared for 5 yrs  . VAGINAL HYSTERECTOMY    . VAGINAL PROLAPSE REPAIR      Medical History: No past medical history on file.  Family History: Family History  Problem Relation Age of Onset  . Breast cancer Neg Hx       Review of Systems  Constitutional: Negative for chills, diaphoresis and fatigue.  HENT: Negative for ear pain, postnasal drip and sinus pressure.   Eyes: Negative for photophobia, discharge, redness, itching and visual disturbance.  Respiratory: Negative for cough, shortness of breath and wheezing.   Cardiovascular: Negative for chest pain, palpitations and leg swelling.  Gastrointestinal: Negative for abdominal pain, constipation, diarrhea, nausea and vomiting.  Genitourinary: Negative for dysuria and flank pain.   Musculoskeletal: Negative for arthralgias, back pain, gait problem and neck pain.  Skin: Negative for color change.  Allergic/Immunologic: Negative for environmental allergies and food allergies.  Neurological: Negative for dizziness and headaches.  Hematological: Does not bruise/bleed easily.  Psychiatric/Behavioral: Negative for agitation, behavioral problems (depression) and hallucinations.     Vital Signs: BP 138/72 (BP Location: Left Arm)   Pulse 74   Temp (!) 97 F (36.1 C)   Resp 16   Ht 5' 8"  (1.727 m)   Wt 153 lb 12.8 oz (69.8 kg)   SpO2 99%   BMI 23.39 kg/m    Physical Exam Constitutional:      General: She is not in acute distress.    Appearance: She is well-developed. She is not diaphoretic.  HENT:     Head: Normocephalic and atraumatic.     Mouth/Throat:     Pharynx: No oropharyngeal exudate.  Eyes:     Pupils: Pupils are equal, round, and reactive to light.  Neck:     Thyroid: No thyromegaly.     Vascular: No JVD.     Trachea: No tracheal deviation.  Cardiovascular:     Rate and Rhythm: Normal rate and regular rhythm.     Heart sounds: Normal heart sounds. No murmur heard.  No friction rub. No gallop.   Pulmonary:     Effort: Pulmonary effort is normal. No respiratory distress.     Breath sounds: No wheezing or rales.  Chest:     Chest wall: No tenderness.  Breasts:        Right: Normal. No nipple discharge.        Left: Normal. No nipple discharge.  Abdominal:     General: Bowel sounds are normal.     Palpations: Abdomen is soft.  Musculoskeletal:        General: Normal range of motion.     Cervical back: Normal range of motion and neck supple.  Lymphadenopathy:     Cervical: No cervical adenopathy.  Skin:    General: Skin is warm and dry.  Neurological:     Mental Status: She is alert and oriented to person, place, and time.     Cranial Nerves: No cranial nerve deficit.  Psychiatric:        Behavior: Behavior normal.        Thought  Content: Thought content normal.        Judgment: Judgment normal.      LABS: Recent Results (from the past 2160 hour(s))  Urinalysis, Routine w reflex microscopic     Status: Abnormal   Collection Time: 02/21/20  4:51 PM  Result Value Ref Range   Specific Gravity, UA 1.018 1.005 - 1.030   pH, UA 5.0 5.0 - 7.5   Color, UA Yellow Yellow   Appearance Ur Clear Clear   Leukocytes,UA 2+ (A) Negative   Protein,UA Negative Negative/Trace   Glucose, UA Negative Negative   Ketones, UA Negative Negative   RBC, UA Negative Negative   Bilirubin, UA Negative Negative   Urobilinogen, Ur 0.2 0.2 - 1.0 mg/dL   Nitrite, UA Positive (A) Negative   Microscopic Examination See below:     Comment: Microscopic was indicated and was performed.  Microscopic Examination     Status: Abnormal   Collection Time: 02/21/20  4:51 PM   Urine  Result Value Ref Range   WBC, UA >30 (A) 0 - 5 /hpf   RBC 0-2 0 - 2 /hpf   Epithelial Cells (non renal) 0-10 0 - 10 /hpf   Casts None seen None seen /lpf   Bacteria, UA Many (A) None seen/Few  Comprehensive metabolic panel     Status: Abnormal   Collection Time: 02/22/20  8:48 AM  Result Value Ref Range   Glucose 99 65 - 99 mg/dL   BUN 14 8 - 27 mg/dL   Creatinine, Ser 0.63 0.57 - 1.00 mg/dL   GFR calc non Af Amer 88 >59 mL/min/1.73   GFR calc Af Amer 101 >59 mL/min/1.73    Comment: **[facility] currently reports eGFR in compliance with the current**   recommendations of the [employer]. [facility] will   update reporting as new guidelines are published from the NKF-ASN   Task force.    BUN/Creatinine Ratio 22 12 - 28   Sodium 141 134 - 144 mmol/L   Potassium 4.4 3.5 - 5.2 mmol/L   Chloride 107 (H) 96 - 106 mmol/L   CO2 23 20 - 29 mmol/L   Calcium 10.2 8.7 - 10.3 mg/dL   Total Protein 6.5 6.0 - 8.5 g/dL   Albumin 4.6 3.7 - 4.7 g/dL   Globulin, Total 1.9 1.5 - 4.5 g/dL   Albumin/Globulin Ratio 2.4 (H) 1.2 - 2.2   Bilirubin Total 0.2 0.0 - 1.2  mg/dL   Alkaline Phosphatase 106 48 - 121 IU/L   AST 14 0 - 40 IU/L   ALT 17 0 - 32 IU/L  B12 and Folate Panel     Status: None   Collection  Time: 02/22/20  8:48 AM  Result Value Ref Range   Vitamin B-12 831 232 - 1,245 pg/mL   Folate >20.0 >3.0 ng/mL    Comment: A serum folate concentration of less than 3.1 ng/mL is considered to represent clinical deficiency.    Assessment/Plan: 1. Encounter for general adult medical examination with abnormal findings - PHM is updated.  2. Other hyperlipidemia - Continue to monitor  3. Sinoatrial node dysfunction (HCC) - Stable, has seen cardiology, heart rate is wnl  4. Visit for screening mammogram - MM DIGITAL SCREENING BILATERAL; Future  5. Encounter for screening colonoscopy - Family history of colon cancer  - Ambulatory referral to Gastroenterology  6. Impacted cerumen of right ear - Treated with debrox and prepped for ear lavage  - Ear Lavage  7. Dysuria - Cipro 500 mg bid  - Urinalysis, Routine w reflex microscopic  General Counseling: christabel camire understanding of the findings of todays visit and agrees with plan of treatment. I have discussed any further diagnostic evaluation that may be needed or ordered today. We also reviewed her medications today. she has been encouraged to call the office with any questions or concerns that should arise related to todays visit.  Counseling: Cardiac risk factor modification:  1. Control blood pressure. 2. Exercise as prescribed. 3. Follow low sodium, low fat diet. and low fat and low cholestrol diet. 4. Take ASA 57m once a day. 5. Restricted calories diet to lose weight.   Orders Placed This Encounter  Procedures  . Microscopic Examination  . MM DIGITAL SCREENING BILATERAL  . Urinalysis  . CBC with Differential/Platelet  . Lipid Panel With LDL/HDL Ratio  . TSH  . T4, free  . Comprehensive metabolic panel  . B12 and Folate Panel  . Urinalysis, Routine w reflex  microscopic  . Ambulatory referral to Gastroenterology  . Ear Lavage     Total time spent:45 Minutes  Time spent includes review of chart, medications, test results, and follow up plan with the patient.     FLavera Guise MD  Internal Medicine

## 2020-02-22 ENCOUNTER — Other Ambulatory Visit: Payer: Self-pay | Admitting: Internal Medicine

## 2020-02-22 DIAGNOSIS — D510 Vitamin B12 deficiency anemia due to intrinsic factor deficiency: Secondary | ICD-10-CM | POA: Diagnosis not present

## 2020-02-22 DIAGNOSIS — Z1231 Encounter for screening mammogram for malignant neoplasm of breast: Secondary | ICD-10-CM | POA: Diagnosis not present

## 2020-02-22 DIAGNOSIS — Z0001 Encounter for general adult medical examination with abnormal findings: Secondary | ICD-10-CM | POA: Diagnosis not present

## 2020-02-22 DIAGNOSIS — R300 Dysuria: Secondary | ICD-10-CM | POA: Diagnosis not present

## 2020-02-22 DIAGNOSIS — Z1211 Encounter for screening for malignant neoplasm of colon: Secondary | ICD-10-CM | POA: Diagnosis not present

## 2020-02-22 DIAGNOSIS — I495 Sick sinus syndrome: Secondary | ICD-10-CM | POA: Diagnosis not present

## 2020-02-23 ENCOUNTER — Telehealth: Payer: Self-pay

## 2020-02-23 ENCOUNTER — Other Ambulatory Visit: Payer: Self-pay

## 2020-02-23 LAB — URINALYSIS, ROUTINE W REFLEX MICROSCOPIC
Bilirubin, UA: NEGATIVE
Glucose, UA: NEGATIVE
Ketones, UA: NEGATIVE
Nitrite, UA: POSITIVE — AB
Protein,UA: NEGATIVE
RBC, UA: NEGATIVE
Specific Gravity, UA: 1.018 (ref 1.005–1.030)
Urobilinogen, Ur: 0.2 mg/dL (ref 0.2–1.0)
pH, UA: 5 (ref 5.0–7.5)

## 2020-02-23 LAB — COMPREHENSIVE METABOLIC PANEL
ALT: 17 IU/L (ref 0–32)
AST: 14 IU/L (ref 0–40)
Albumin/Globulin Ratio: 2.4 — ABNORMAL HIGH (ref 1.2–2.2)
Albumin: 4.6 g/dL (ref 3.7–4.7)
Alkaline Phosphatase: 106 IU/L (ref 48–121)
BUN/Creatinine Ratio: 22 (ref 12–28)
BUN: 14 mg/dL (ref 8–27)
Bilirubin Total: 0.2 mg/dL (ref 0.0–1.2)
CO2: 23 mmol/L (ref 20–29)
Calcium: 10.2 mg/dL (ref 8.7–10.3)
Chloride: 107 mmol/L — ABNORMAL HIGH (ref 96–106)
Creatinine, Ser: 0.63 mg/dL (ref 0.57–1.00)
GFR calc Af Amer: 101 mL/min/{1.73_m2} (ref >59)
GFR calc non Af Amer: 88 mL/min/{1.73_m2} (ref >59)
Globulin, Total: 1.9 g/dL (ref 1.5–4.5)
Glucose: 99 mg/dL (ref 65–99)
Potassium: 4.4 mmol/L (ref 3.5–5.2)
Sodium: 141 mmol/L (ref 134–144)
Total Protein: 6.5 g/dL (ref 6.0–8.5)

## 2020-02-23 LAB — MICROSCOPIC EXAMINATION
Casts: NONE SEEN /lpf
WBC, UA: >30 /hpf — AB (ref 0–5)

## 2020-02-23 LAB — B12 AND FOLATE PANEL
Folate: >20 ng/mL (ref >3.0)
Vitamin B-12: 831 pg/mL (ref 232–1245)

## 2020-02-23 MED ORDER — CIPROFLOXACIN HCL 500 MG PO TABS: 500.0000 mg | ORAL_TABLET | Freq: Two times a day (BID) | ORAL | 0 refills | Status: AC

## 2020-02-23 NOTE — Telephone Encounter (Signed)
Called [facility] and added culture with C&S.  Stated they will let us know if specimen is not available.  dbs

## 2020-02-23 NOTE — Telephone Encounter (Signed)
-----   Message from Lavera Guise, MD sent at 02/23/2020  6:49 AM EDT ----- Needs urine c/s please. Call in cipro 500 mg po bid for UTI #14

## 2020-02-23 NOTE — Telephone Encounter (Signed)
PT notified and also let patient know that cipro was sent in to treat uti

## 2020-02-23 NOTE — Progress Notes (Signed)
Needs urine c/s please. Call in cipro 500 mg po bid for UTI #14

## 2020-03-01 ENCOUNTER — Telehealth: Payer: Self-pay

## 2020-03-01 ENCOUNTER — Telehealth (INDEPENDENT_AMBULATORY_CARE_PROVIDER_SITE_OTHER): Payer: Self-pay | Admitting: Gastroenterology

## 2020-03-01 ENCOUNTER — Other Ambulatory Visit: Payer: Self-pay

## 2020-03-01 DIAGNOSIS — Z1211 Encounter for screening for malignant neoplasm of colon: Secondary | ICD-10-CM

## 2020-03-01 NOTE — Progress Notes (Signed)
Gastroenterology Pre-Procedure Review  Request Date: Tuesday 03/20/20 Requesting Physician: Dr. Bonna Gains  PATIENT REVIEW QUESTIONS: The patient responded to the following health history questions as indicated:    1. Are you having any GI issues? no 2. Do you have a personal history of Polyps? yes (patient states she had a few polyps in the past) 3. Do you have a family history of Colon Cancer or Polyps? yes (mother had colon cancer) 4. Diabetes Mellitus? no 5. Joint replacements in the past 12 months?no 6. Major health problems in the past 3 months?no 7. Any artificial heart valves, MVP, or defibrillator?no    MEDICATIONS & ALLERGIES:    Patient reports the following regarding taking any anticoagulation/antiplatelet therapy:   Plavix, Coumadin, Eliquis, Xarelto, Lovenox, Pradaxa, Brilinta, or Effient? no Aspirin? no  Patient confirms/reports the following medications:  Current Outpatient Medications  Medication Sig Dispense Refill  . Calcium Carbonate-Vit D-Min (CALCIUM 1200 PO) Take 1 tablet by mouth daily.    . ciprofloxacin (CIPRO) 500 MG tablet Take 1 tablet (500 mg total) by mouth 2 (two) times daily for 7 days. 14 tablet 0  . losartan (COZAAR) 25 MG tablet Take one tab for bp in am 90 tablet 3  . Multiple Vitamins-Minerals (MULTIVITAMIN WITH MINERALS) tablet Take 1 tablet by mouth daily.     No current facility-administered medications for this visit.    Patient confirms/reports the following allergies:  Allergies  Allergen Reactions  . Codeine Nausea Only and Other (See Comments)    Gives headache Headache   . Hydrocodone Other (See Comments) and Nausea Only    No orders of the defined types were placed in this encounter.   AUTHORIZATION INFORMATION Primary Insurance: 1D#: Group #:  Secondary Insurance: 1D#: Group #:  SCHEDULE INFORMATION: Date: Tuesday 03/20/20 Time: Location:[facility]

## 2020-03-01 NOTE — Telephone Encounter (Signed)
Contacted patient on cell number provided in chart and was told I had the wrong number.  I was unable to contact on home phone due to  Line busy.  Will try again on the home number at 2:30pm.  Thanks,  Sharyn Lull, [place]

## 2020-03-01 NOTE — Telephone Encounter (Signed)
Contacted patient on her home number.  Colonoscopy scheduled for  Tuesday 03/20/20 at [facility] with Dr. Bonna Gains.  Triage completed in Televisit note.  Thanks,  [place], [place]

## 2020-03-14 ENCOUNTER — Other Ambulatory Visit: Payer: Self-pay

## 2020-03-14 ENCOUNTER — Encounter: Payer: Self-pay | Admitting: Gastroenterology

## 2020-03-16 ENCOUNTER — Other Ambulatory Visit
Admission: RE | Admit: 2020-03-16 | Discharge: 2020-03-16 | Disposition: A | Discharge status: Home / Self Care | Payer: Medicare PPO | Source: Ambulatory Visit | Attending: Gastroenterology | Admitting: Gastroenterology

## 2020-03-16 ENCOUNTER — Other Ambulatory Visit: Payer: Self-pay

## 2020-03-16 DIAGNOSIS — Z01812 Encounter for preprocedural laboratory examination: Secondary | ICD-10-CM | POA: Insufficient documentation

## 2020-03-16 DIAGNOSIS — Z20822 Contact with and (suspected) exposure to covid-19: Secondary | ICD-10-CM | POA: Diagnosis not present

## 2020-03-16 LAB — SARS CORONAVIRUS 2 (TAT 6-24 HRS): SARS Coronavirus 2: NEGATIVE

## 2020-03-19 NOTE — Discharge Instructions (Signed)
General Anesthesia, Adult, Care After This sheet gives you information about how to care for yourself after your procedure. Your health care provider may also give you more specific instructions. If you have problems or questions, contact your health care provider. What can I expect after the procedure? After the procedure, the following side effects are common:  Pain or discomfort at the IV site.  Nausea.  Vomiting.  Sore throat.  Trouble concentrating.  Feeling cold or chills.  Weak or tired.  Sleepiness and fatigue.  Soreness and body aches. These side effects can affect parts of the body that were not involved in surgery. Follow these instructions at home:  For at least 24 hours after the procedure:  Have a responsible adult stay with you. It is important to have someone help care for you until you are awake and alert.  Rest as needed.  Do not: ? Participate in activities in which you could fall or become injured. ? Drive. ? Use heavy machinery. ? Drink alcohol. ? Take sleeping pills or medicines that cause drowsiness. ? Make important decisions or sign legal documents. ? Take care of children on your own. Eating and drinking  Follow any instructions from your health care provider about eating or drinking restrictions.  When you feel hungry, start by eating small amounts of foods that are soft and easy to digest (bland), such as toast. Gradually return to your regular diet.  Drink enough fluid to keep your urine pale yellow.  If you vomit, rehydrate by drinking water, juice, or clear broth. General instructions  If you have sleep apnea, surgery and certain medicines can increase your risk for breathing problems. Follow instructions from your health care provider about wearing your sleep device: ? Anytime you are sleeping, including during daytime naps. ? While taking prescription pain medicines, sleeping medicines, or medicines that make you drowsy.  Return to  your normal activities as told by your health care provider. Ask your health care provider what activities are safe for you.  Take over-the-counter and prescription medicines only as told by your health care provider.  If you smoke, do not smoke without supervision.  Keep all follow-up visits as told by your health care provider. This is important. Contact a health care provider if:  You have nausea or vomiting that does not get better with medicine.  You cannot eat or drink without vomiting.  You have pain that does not get better with medicine.  You are unable to pass urine.  You develop a skin rash.  You have a fever.  You have redness around your IV site that gets worse. Get help right away if:  You have difficulty breathing.  You have chest pain.  You have blood in your urine or stool, or you vomit blood. Summary  After the procedure, it is common to have a sore throat or nausea. It is also common to feel tired.  Have a responsible adult stay with you for the first 24 hours after general anesthesia. It is important to have someone help care for you until you are awake and alert.  When you feel hungry, start by eating small amounts of foods that are soft and easy to digest (bland), such as toast. Gradually return to your regular diet.  Drink enough fluid to keep your urine pale yellow.  Return to your normal activities as told by your health care provider. Ask your health care provider what activities are safe for you. This information is not   intended to replace advice given to you by your health care provider. Make sure you discuss any questions you have with your health care provider. Document Revised: 07/31/2017 Document Reviewed: 03/13/2017 Elsevier Patient Education  2020 Elsevier Inc.  

## 2020-03-20 ENCOUNTER — Encounter: Payer: Self-pay | Admitting: Gastroenterology

## 2020-03-20 ENCOUNTER — Ambulatory Visit
Admission: RE | Admit: 2020-03-20 | Discharge: 2020-03-20 | Disposition: A | Discharge status: Home / Self Care | Payer: Medicare PPO | Attending: Gastroenterology | Admitting: Gastroenterology

## 2020-03-20 ENCOUNTER — Other Ambulatory Visit: Payer: Self-pay

## 2020-03-20 ENCOUNTER — Ambulatory Visit: Payer: Medicare PPO | Admitting: Anesthesiology

## 2020-03-20 ENCOUNTER — Encounter
Admission: RE | Disposition: A | Discharge status: Home / Self Care | Payer: Self-pay | Source: Home / Self Care | Attending: Gastroenterology

## 2020-03-20 DIAGNOSIS — Z800 Family history of malignant neoplasm of digestive organs: Secondary | ICD-10-CM | POA: Diagnosis not present

## 2020-03-20 DIAGNOSIS — D123 Benign neoplasm of transverse colon: Secondary | ICD-10-CM | POA: Diagnosis not present

## 2020-03-20 DIAGNOSIS — I10 Essential (primary) hypertension: Secondary | ICD-10-CM | POA: Diagnosis not present

## 2020-03-20 DIAGNOSIS — Z1211 Encounter for screening for malignant neoplasm of colon: Secondary | ICD-10-CM | POA: Diagnosis not present

## 2020-03-20 DIAGNOSIS — Z79899 Other long term (current) drug therapy: Secondary | ICD-10-CM | POA: Insufficient documentation

## 2020-03-20 DIAGNOSIS — D122 Benign neoplasm of ascending colon: Secondary | ICD-10-CM | POA: Insufficient documentation

## 2020-03-20 DIAGNOSIS — K5730 Diverticulosis of large intestine without perforation or abscess without bleeding: Secondary | ICD-10-CM | POA: Insufficient documentation

## 2020-03-20 DIAGNOSIS — K635 Polyp of colon: Secondary | ICD-10-CM | POA: Insufficient documentation

## 2020-03-20 HISTORY — PX: COLONOSCOPY WITH PROPOFOL: SHX5780

## 2020-03-20 HISTORY — DX: Essential (primary) hypertension: I10

## 2020-03-20 HISTORY — PX: POLYPECTOMY: SHX5525

## 2020-03-20 SURGERY — COLONOSCOPY WITH PROPOFOL
Anesthesia: General | Site: Rectum

## 2020-03-20 MED ORDER — SODIUM CHLORIDE 0.9 % IV SOLN: INTRAVENOUS | Status: DC

## 2020-03-20 MED ORDER — LIDOCAINE HCL (CARDIAC) PF 100 MG/5ML IV SOSY
PREFILLED_SYRINGE | INTRAVENOUS | Status: DC | PRN
  Administered 2020-03-20: 40 mg via INTRAVENOUS

## 2020-03-20 MED ORDER — LACTATED RINGERS IV SOLN
INTRAVENOUS | Status: DC | PRN
Start: 2020-03-20 — End: 2020-03-20

## 2020-03-20 MED ORDER — PROPOFOL 10 MG/ML IV BOLUS
INTRAVENOUS | Status: DC | PRN
  Administered 2020-03-20: 10 mg via INTRAVENOUS
  Administered 2020-03-20 (×7): 20 mg via INTRAVENOUS
  Administered 2020-03-20: 30 mg via INTRAVENOUS
  Administered 2020-03-20 (×8): 20 mg via INTRAVENOUS
  Administered 2020-03-20: 30 mg via INTRAVENOUS
  Administered 2020-03-20 (×6): 20 mg via INTRAVENOUS
  Administered 2020-03-20: 70 mg via INTRAVENOUS
  Administered 2020-03-20: 20 mg via INTRAVENOUS
  Administered 2020-03-20: 10 mg via INTRAVENOUS
  Administered 2020-03-20: 30 mg via INTRAVENOUS
  Administered 2020-03-20: 20 mg via INTRAVENOUS

## 2020-03-20 MED ORDER — SODIUM CHLORIDE (PF) 0.9 % IJ SOLN
INTRAMUSCULAR | Status: DC | PRN
  Administered 2020-03-20: 10 mL

## 2020-03-20 MED ORDER — STERILE WATER FOR IRRIGATION IR SOLN
Status: DC | PRN
  Administered 2020-03-20: 100 mL

## 2020-03-20 SURGICAL SUPPLY — 27 items
CLIP HMST 235XBRD CATH ROT (MISCELLANEOUS) IMPLANT
CLIP RESOLUTION 360 11X235 (MISCELLANEOUS)
ELECT REM PT RETURN 9FT ADLT (ELECTROSURGICAL) ×3
ELECTRODE REM PT RTRN 9FT ADLT (ELECTROSURGICAL) ×1 IMPLANT
ELEVIEW  INJECTABLE COMP 10 (MISCELLANEOUS) ×2
FCP ESCP3.2XJMB 240X2.8X (MISCELLANEOUS) ×1
FORCEPS BIOP RAD 4 LRG CAP 4 (CUTTING FORCEPS) IMPLANT
FORCEPS BIOP RJ4 240 W/NDL (MISCELLANEOUS) ×3
FORCEPS ESCP3.2XJMB 240X2.8X (MISCELLANEOUS) ×1 IMPLANT
GOWN CVR UNV OPN BCK APRN NK (MISCELLANEOUS) ×2 IMPLANT
GOWN ISOL THUMB LOOP REG UNIV (MISCELLANEOUS) ×6
INJECTABLE ELEVIEW COMP 10 (MISCELLANEOUS) ×1 IMPLANT
INJECTOR VARIJECT VIN23 (MISCELLANEOUS) ×1 IMPLANT
KIT DEFENDO VALVE AND CONN (KITS) IMPLANT
KIT ENDO PROCEDURE OLY (KITS) ×3 IMPLANT
MANIFOLD NEPTUNE II (INSTRUMENTS) ×3 IMPLANT
MARKER SPOT ENDO TATTOO 5ML (MISCELLANEOUS) IMPLANT
PROBE APC STR FIRE (PROBE) IMPLANT
RETRIEVER NET ROTH 2.5X230 LF (MISCELLANEOUS) ×3 IMPLANT
SNARE LASSO HEX 3 IN 1 (INSTRUMENTS) ×3 IMPLANT
SNARE SHORT THROW 13M SML OVAL (MISCELLANEOUS) IMPLANT
SNARE SHORT THROW 30M LRG OVAL (MISCELLANEOUS) IMPLANT
SNARE SNG USE RND 15MM (INSTRUMENTS) IMPLANT
SPOT EX ENDOSCOPIC TATTOO (MISCELLANEOUS)
TRAP ETRAP POLY (MISCELLANEOUS) IMPLANT
VARIJECT INJECTOR VIN23 (MISCELLANEOUS) ×3
WATER STERILE IRR 250ML POUR (IV SOLUTION) ×3 IMPLANT

## 2020-03-20 NOTE — Op Note (Signed)
[facility] Gastroenterology Patient Name: Cindy Duke Procedure Date: 03/20/2020 9:44 AM MRN: [id number] Account #: [id number] Date of Birth: 07/31/1946 Admit Type: Outpatient Age: 74 Room: [facility] OR ROOM 01 Gender: Female Note Status: Finalized Procedure:             Colonoscopy Indications:           Screening in patient at increased risk: Family history                         of 1st-degree relative with colorectal cancer Providers:             Falicity Sheets B. Bonna Gains MD, MD Referring MD:          Lavera Guise, MD (Referring MD) Medicines:             Monitored Anesthesia Care Complications:         No immediate complications. Procedure:             Pre-Anesthesia Assessment:                        - ASA Grade Assessment: II - A patient with mild                         systemic disease.                        - Prior to the procedure, a History and Physical was                         performed, and patient medications, allergies and                         sensitivities were reviewed. The patient's tolerance                         of previous anesthesia was reviewed.                        - The risks and benefits of the procedure and the                         sedation options and risks were discussed with the                         patient. All questions were answered and informed                         consent was obtained.                        - Patient identification and proposed procedure were                         verified prior to the procedure by the physician, the                         nurse, the anesthesiologist, the anesthetist and the  technician. The procedure was verified in the                         procedure room.                        After obtaining informed consent, the colonoscope was                         passed under direct vision. Throughout the procedure,                         the patient's  blood pressure, pulse, and oxygen                         saturations were monitored continuously. The                         Colonoscope was introduced through the anus and                         advanced to the the cecum, identified by appendiceal                         orifice and ileocecal valve. The colonoscopy was                         performed with ease. The patient tolerated the                         procedure well. The quality of the bowel preparation                         was good except the sigmoid colon was fair and the                         descending colon was fair. Findings:      The perianal and digital rectal examinations were normal.      A 4 mm polyp was found in the ascending colon. The polyp was flat. The       polyp was removed with a jumbo cold forceps. Resection and retrieval       were complete.      Two flat polyps were found in the hepatic flexure. The polyps were 10 to       13 mm in size. Area was successfully injected with 10 mL total between       two polyps, Eleview for lesion assessment, and this injection appeared       to lift the lesion adequately. These polyps were removed with a hot       snare. Resection and retrieval were complete.      A 5 mm polyp was found in the descending colon. The polyp was flat. The       polyp was removed with a jumbo cold forceps. Resection and retrieval       were complete.      Multiple diverticula were found in the sigmoid colon.      The exam was otherwise without abnormality.      The rectum, sigmoid colon, descending colon, transverse colon, ascending  colon and cecum appeared normal.      The retroflexed view of the distal rectum and anal verge was normal and       showed no anal or rectal abnormalities. Impression:            - One 4 mm polyp in the ascending colon, removed with                         a jumbo cold forceps. Resected and retrieved.                        - Two 10 to 13 mm polyps  at the hepatic flexure,                         removed with a hot snare. Resected and retrieved.                         Injected.                        - One 5 mm polyp in the descending colon, removed with                         a jumbo cold forceps. Resected and retrieved.                        - Diverticulosis in the sigmoid colon.                        - The examination was otherwise normal.                        - The rectum, sigmoid colon, descending colon,                         transverse colon, ascending colon and cecum are normal.                        - The distal rectum and anal verge are normal on                         retroflexion view. Recommendation:        - Discharge patient to home (with escort).                        - High fiber diet.                        - Advance diet as tolerated.                        - Continue present medications.                        - Await pathology results.                        - Repeat colonoscopy to be discussed in clinic, to  discuss previous family history.                        - The findings and recommendations were discussed with                         the patient.                        - The findings and recommendations were discussed with                         the patient's family.                        - Return to primary care physician as previously                         scheduled. Procedure Code(s):     --- Professional ---                        [phone], Colonoscopy, flexible; with removal of                         tumor(s), polyp(s), or other lesion(s) by snare                         technique                        45380, 93, Colonoscopy, flexible; with biopsy, single                         or multiple                        45381, Colonoscopy, flexible; with directed submucosal                         injection(s), any substance Diagnosis Code(s):     --- Professional  ---                        K63.5, Polyp of colon                        Z80.0, Family history of malignant neoplasm of                         digestive organs CPT copyright 2019 American Medical Association. All rights reserved. The codes documented in this report are preliminary and upon coder review may  be revised to meet current compliance requirements.  Vonda Antigua, MD Margretta Sidle B. Bonna Gains MD, MD 03/20/2020 11:17:20 AM This report has been signed electronically. Number of Addenda: 0 Note Initiated On: 03/20/2020 9:44 AM Scope Withdrawal Time: 0 hours 53 minutes 3 seconds  Total Procedure Duration: 1 hour 7 minutes 1 second  Estimated Blood Loss:  Estimated blood loss: none.      [facility]

## 2020-03-20 NOTE — Anesthesia Procedure Notes (Signed)
Performed by: Shamonique Battiste, CRNA Pre-anesthesia Checklist: Patient identified, Emergency Drugs available, Suction available, Timeout performed and Patient being monitored Patient Re-evaluated:Patient Re-evaluated prior to induction Oxygen Delivery Method: Nasal cannula Placement Confirmation: positive ETCO2       

## 2020-03-20 NOTE — Transfer of Care (Signed)
Immediate Anesthesia Transfer of Care Note  Patient: Cindy Duke  Procedure(s) Performed: COLONOSCOPY WITH PROPOFOL (N/A Rectum) POLYPECTOMY (N/A Rectum)  Patient Location: PACU  Anesthesia Type: General  Level of Consciousness: awake, alert  and patient cooperative  Airway and Oxygen Therapy: Patient Spontanous Breathing and Patient connected to supplemental oxygen  Post-op Assessment: Post-op Vital signs reviewed, Patient's Cardiovascular Status Stable, Respiratory Function Stable, Patent Airway and No signs of Nausea or vomiting  Post-op Vital Signs: Reviewed and stable  Complications: No complications documented.

## 2020-03-20 NOTE — Anesthesia Preprocedure Evaluation (Signed)
Anesthesia Evaluation  Patient identified by MRN, date of birth, ID band  History of Anesthesia Complications Negative for: history of anesthetic complications  Airway Mallampati: III  TM Distance: >3 FB Neck ROM: Full  Mouth opening: Limited Mouth Opening  Dental no notable dental hx.    Pulmonary neg pulmonary ROS,    Pulmonary exam normal        Cardiovascular Exercise Tolerance: Good hypertension, Normal cardiovascular exam     Neuro/Psych negative neurological ROS     GI/Hepatic negative GI ROS, Neg liver ROS,   Endo/Other  negative endocrine ROS  Renal/GU negative Renal ROS     Musculoskeletal negative musculoskeletal ROS (+)   Abdominal   Peds  Hematology negative hematology ROS (+)   Anesthesia Other Findings   Reproductive/Obstetrics                             Anesthesia Physical Anesthesia Plan  ASA: II  Anesthesia Plan: General   Post-op Pain Management:    Induction: Intravenous  PONV Risk Score and Plan: 3 and TIVA, Propofol infusion and Treatment may vary due to age or medical condition  Airway Management Planned: Nasal Cannula and Natural Airway  Additional Equipment: None  Intra-op Plan:   Post-operative Plan:   Informed Consent: I have reviewed the patients History and Physical, chart, labs and discussed the procedure including the risks, benefits and alternatives for the proposed anesthesia with the patient or authorized representative who has indicated his/her understanding and acceptance.       Plan Discussed with: CRNA  Anesthesia Plan Comments:         Anesthesia Quick Evaluation

## 2020-03-20 NOTE — Anesthesia Postprocedure Evaluation (Signed)
Anesthesia Post Note  Patient: Cindy Duke  Procedure(s) Performed: COLONOSCOPY WITH PROPOFOL (N/A Rectum) POLYPECTOMY (N/A Rectum)     Patient location during evaluation: PACU Anesthesia Type: General Level of consciousness: awake and alert Pain management: pain level controlled Vital Signs Assessment: post-procedure vital signs reviewed and stable Respiratory status: spontaneous breathing, nonlabored ventilation, respiratory function stable and patient connected to nasal cannula oxygen Cardiovascular status: blood pressure returned to baseline and stable Postop Assessment: no apparent nausea or vomiting Anesthetic complications: no   No complications documented.  Adele Barthel Ledia Hanford

## 2020-03-20 NOTE — H&P (Addendum)
Vonda Antigua, MD [address], [place], [place], [place], [address] [address], [place], [place], [place], [address] Phone: [phone]  Fax: [phone]  Primary Care Physician:  Lavera Guise, MD   Pre-Procedure History & Physical: HPI:  Cindy Duke is a 74 y.o. female is here for a colonoscopy.   Past Medical History:  Diagnosis Date  . Hypertension     Past Surgical History:  Procedure Laterality Date  . BREAST BIOPSY Left    bx/clip-neg  . COLONOSCOPY  2016   Dr Vira Agar- cleared for 5 yrs  . VAGINAL HYSTERECTOMY    . VAGINAL PROLAPSE REPAIR      Prior to Admission medications   Medication Sig Start Date End Date Taking? Authorizing Provider  Calcium Carbonate-Vit D-Min (CALCIUM 1200 PO) Take 1 tablet by mouth daily.   Yes [provider]  losartan (COZAAR) 25 MG tablet Take one tab for bp in am 02/01/19  Yes Lavera Guise, MD  Multiple Vitamins-Minerals (MULTIVITAMIN WITH MINERALS) tablet Take 1 tablet by mouth daily.   Yes [provider]  Multiple Vitamins-Minerals (ZINC PO) Take by mouth daily.   Yes [provider]    Allergies as of 03/01/2020 - Review Complete 03/01/2020  Allergen Reaction Noted  . Codeine Nausea Only and Other (See Comments) 08/02/2013  . Hydrocodone Other (See Comments) and Nausea Only 09/08/2012    Family History  Problem Relation Age of Onset  . Breast cancer Neg Hx     Social History   Socioeconomic History  . Marital status: Married    Spouse name: Not on file  . Number of children: Not on file  . Years of education: Not on file  . Highest education level: Not on file  Occupational History  . Not on file  Tobacco Use  . Smoking status: Never Smoker  . Smokeless tobacco: Never Used  Vaping Use  . Vaping Use: Never used  Substance and Sexual Activity  . Alcohol use: No    Alcohol/week: 0.0 standard drinks    Comment: rare wine  . Drug use: No  . Sexual activity: Not  on file  Other Topics Concern  . Not on file  Social History Narrative  . Not on file   Social Determinants of Health   Financial Resource Strain:   . Difficulty of Paying Living Expenses:   Food Insecurity:   . Worried About Charity fundraiser in the Last Year:   . Arboriculturist in the Last Year:   Transportation Needs:   . Film/video editor (Medical):   Marland Kitchen Lack of Transportation (Non-Medical):   Physical Activity:   . Days of Exercise per Week:   . Minutes of Exercise per Session:   Stress:   . Feeling of Stress :   Social Connections:   . Frequency of Communication with Friends and Family:   . Frequency of Social Gatherings with Friends and Family:   . Attends Religious Services:   . Active Member of Clubs or Organizations:   . Attends Archivist Meetings:   Marland Kitchen Marital Status:   Intimate Partner Violence:   . Fear of Current or Ex-Partner:   . Emotionally Abused:   Marland Kitchen Physically Abused:   . Sexually Abused:     Review of Systems: See HPI, otherwise negative ROS  Physical Exam: BP (!) 145/70   Pulse 74   Temp 97.6 F (36.4 C) (Temporal)   Ht 5\' 8"  (1.727  m)   Wt 67.6 kg   SpO2 100%   BMI 22.66 kg/m  General:   Alert,  pleasant and cooperative in NAD Head:  Normocephalic and atraumatic. Neck:  Supple; no masses or thyromegaly. Lungs:  Clear throughout to auscultation, normal respiratory effort.    Heart:  +S1, +S2, Regular rate and rhythm, No edema. Abdomen:  Soft, nontender and nondistended. Normal bowel sounds, without guarding, and without rebound.   Neurologic:  Alert and  oriented x4;  grossly normal neurologically.  Impression/Plan: Cindy Duke is here for a colonoscopy to be performed for family history of colon cancer  Risks, benefits, limitations, and alternatives regarding  colonoscopy have been reviewed with the patient.  Questions have been answered.  All parties agreeable.   Virgel Manifold, MD  03/20/2020,  8:55 AM

## 2020-03-22 LAB — SURGICAL PATHOLOGY

## 2020-03-27 ENCOUNTER — Encounter: Payer: Self-pay | Admitting: Gastroenterology

## 2020-04-10 ENCOUNTER — Other Ambulatory Visit: Payer: Self-pay

## 2020-04-10 DIAGNOSIS — I10 Essential (primary) hypertension: Secondary | ICD-10-CM

## 2020-04-10 MED ORDER — LOSARTAN POTASSIUM 25 MG PO TABS: ORAL_TABLET | ORAL | 1 refills | Status: DC

## 2020-04-26 ENCOUNTER — Ambulatory Visit: Payer: Medicare PPO

## 2020-05-21 IMAGING — MG MM DIGITAL SCREENING BILAT W/ TOMO W/ CAD
8 series · 8 of 24 positions shown · non-contrast
Comparison: Previous exam(s).

CLINICAL DATA: Screening.

EXAM:
DIGITAL SCREENING BILATERAL MAMMOGRAM WITH TOMO AND CAD

[R CC synth-2D]
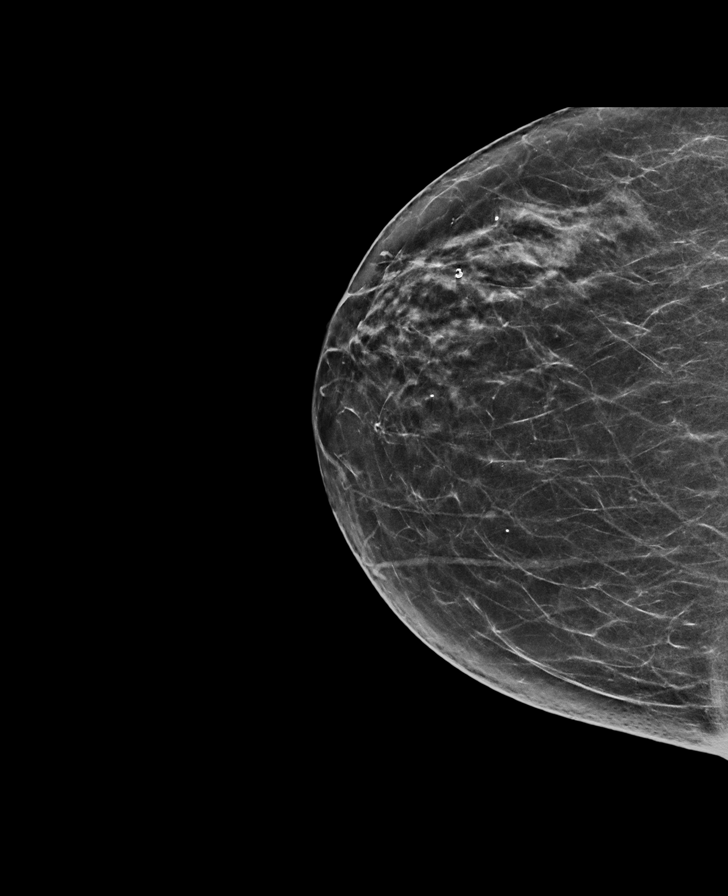

[R MLO synth-2D]
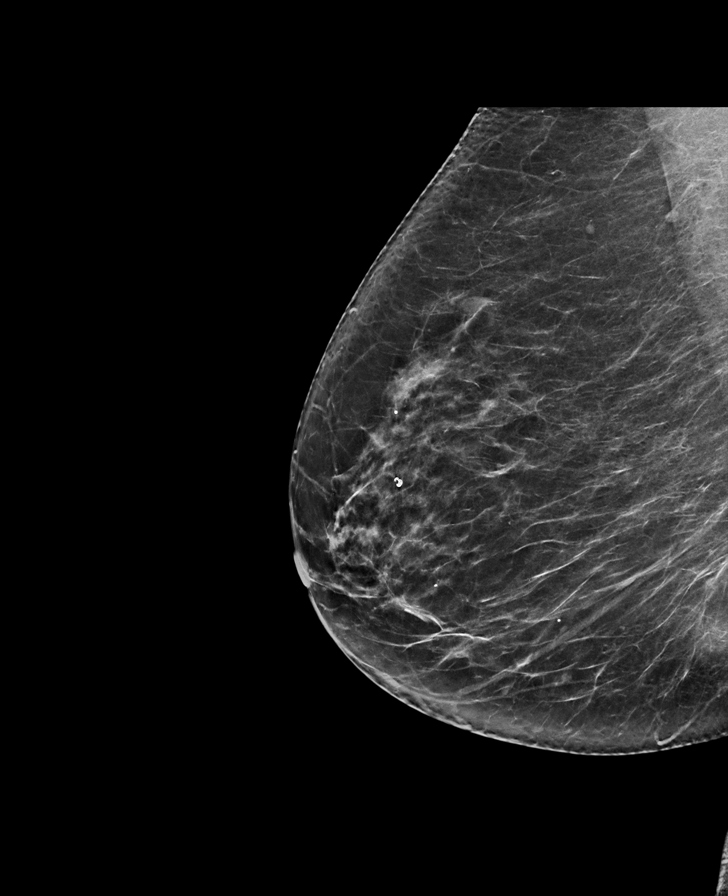

[L CC synth-2D]
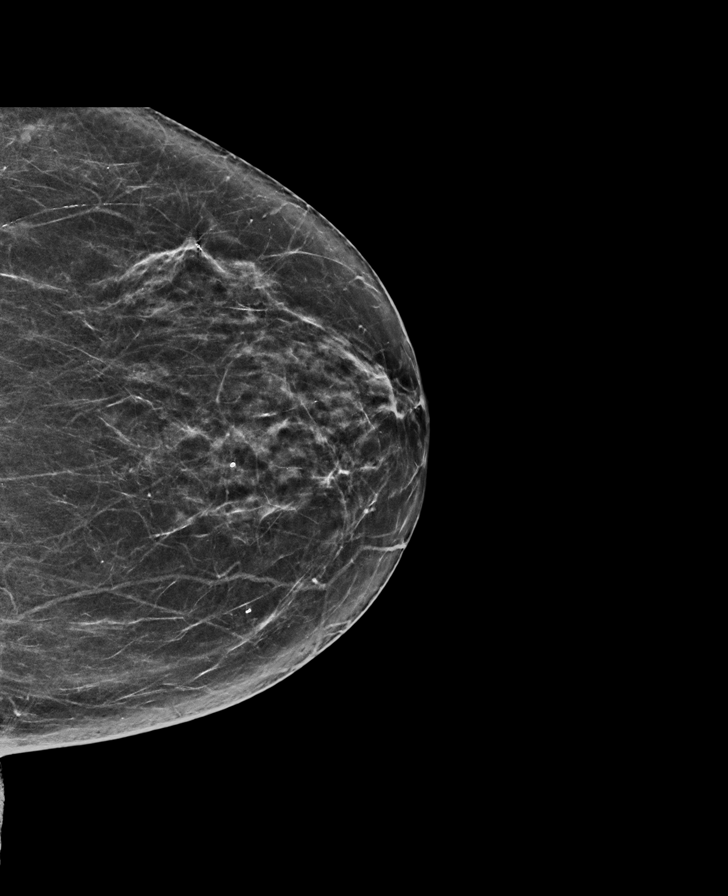

[L MLO synth-2D]
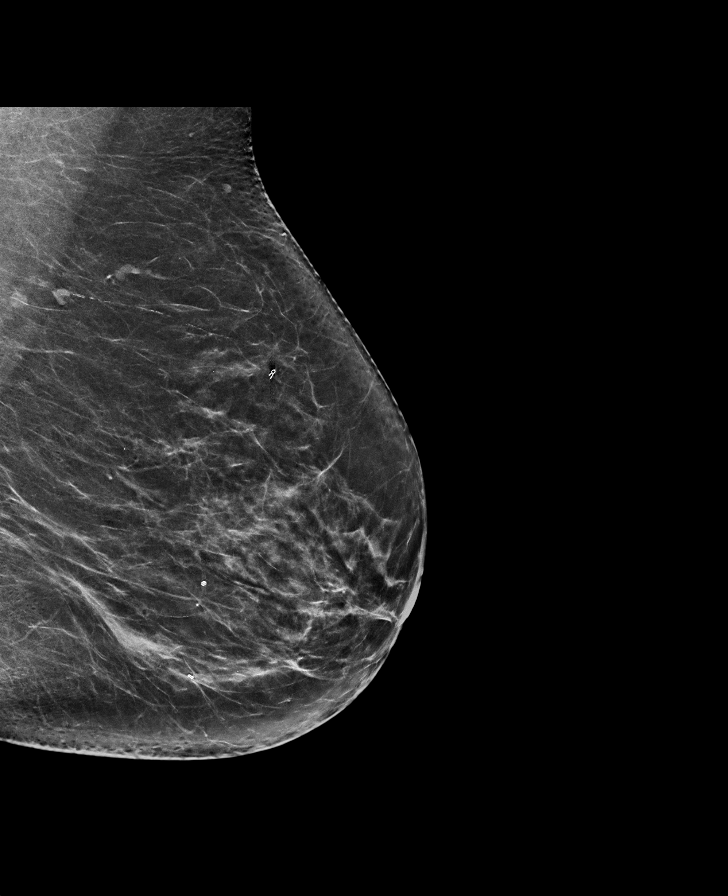

[L MLO tomo · tomo slice 37/72.0]
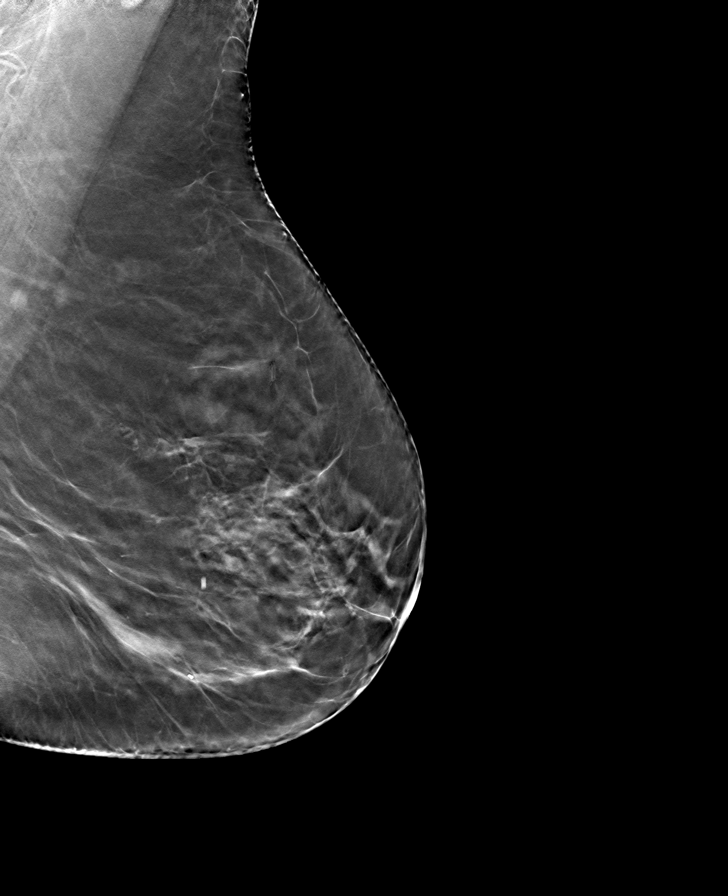

[R MLO tomo · tomo slice 37/74.0]
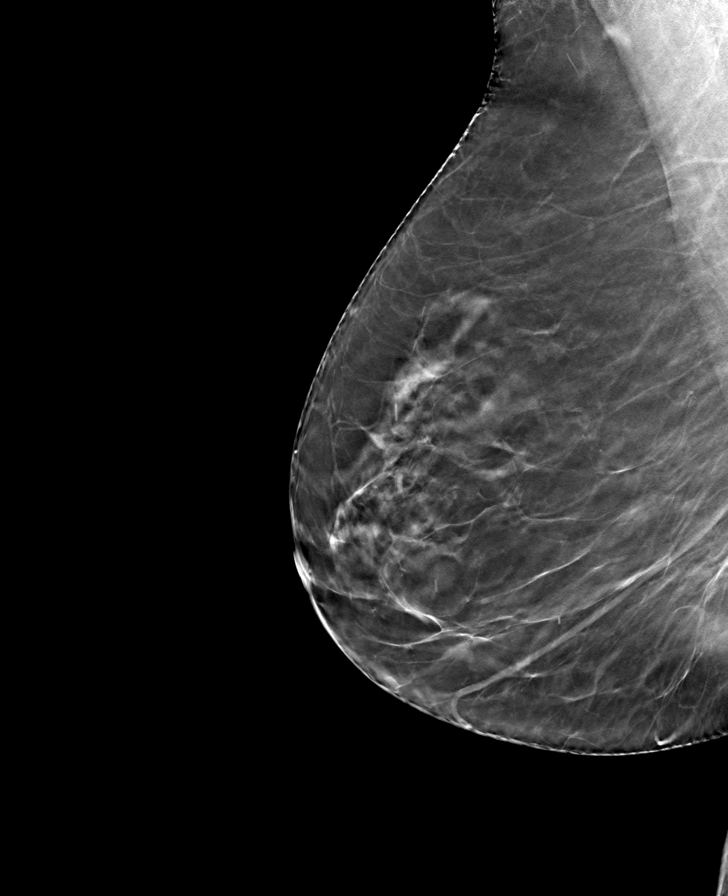

[L CC tomo · tomo slice 33/65.0]
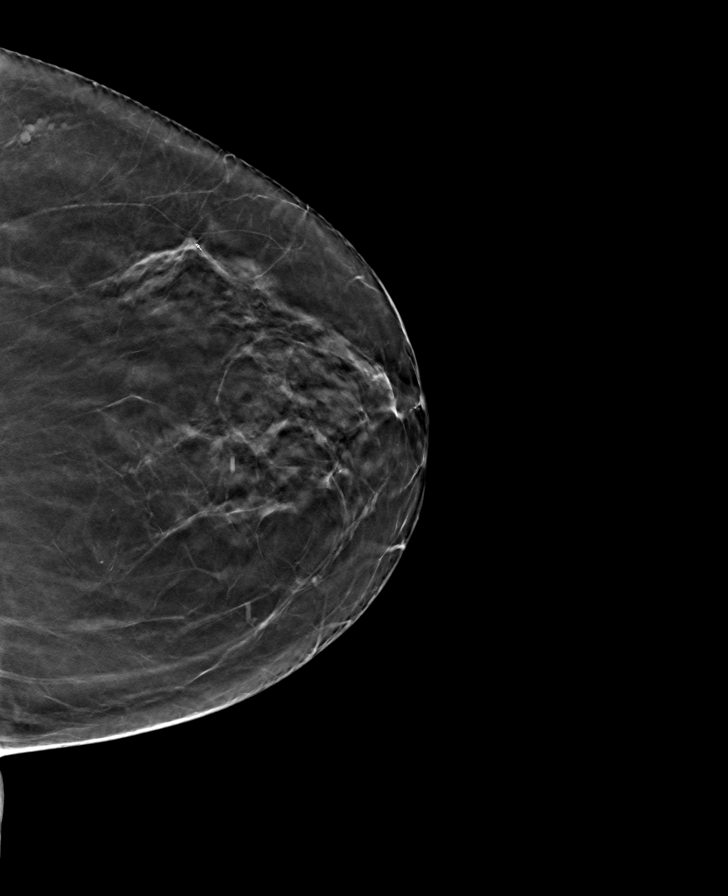

[R CC tomo · tomo slice 33/66.0]
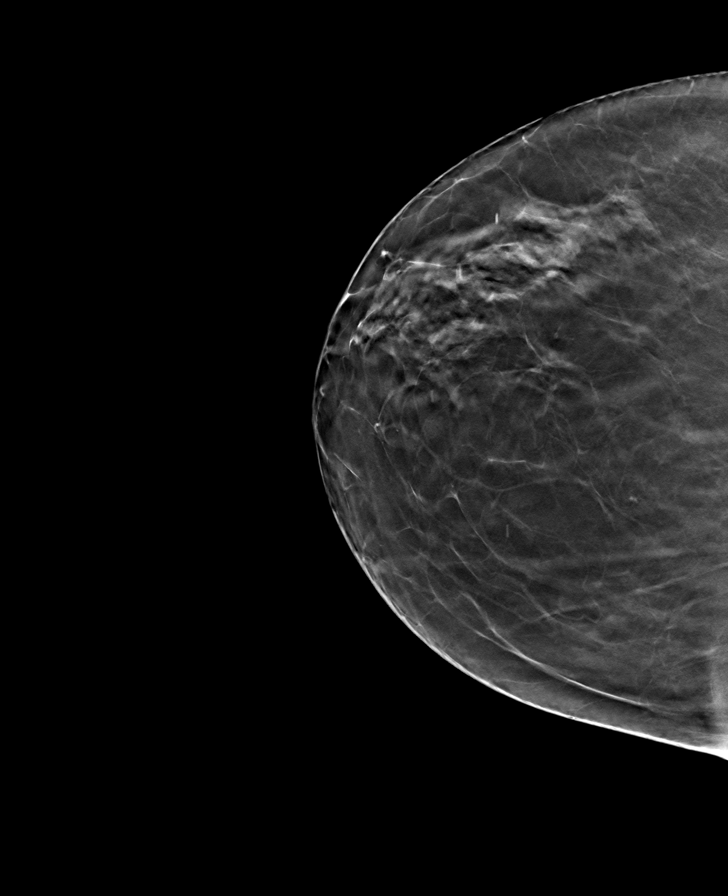

[8 of 24 positions shown; findings below may reference images not displayed]

ACR Breast Density Category b: There are scattered areas of
fibroglandular density.
FINDINGS: There are no findings suspicious for malignancy. Images were
processed with CAD.
IMPRESSION: No mammographic evidence of malignancy. A result letter of this
screening mammogram will be mailed directly to the patient.

RECOMMENDATION:
Screening mammogram in one year. (Code:[id number]-775)

BI-RADS CATEGORY  1: Negative.

## 2020-05-24 ENCOUNTER — Other Ambulatory Visit: Payer: Self-pay

## 2020-05-24 ENCOUNTER — Ambulatory Visit
Admission: RE | Admit: 2020-05-24 | Discharge: 2020-05-24 | Disposition: A | Discharge status: Home / Self Care | Payer: Medicare PPO | Source: Ambulatory Visit | Attending: Internal Medicine | Admitting: Internal Medicine

## 2020-05-24 DIAGNOSIS — Z1231 Encounter for screening mammogram for malignant neoplasm of breast: Secondary | ICD-10-CM | POA: Diagnosis not present

## 2020-06-26 DIAGNOSIS — L218 Other seborrheic dermatitis: Secondary | ICD-10-CM | POA: Diagnosis not present

## 2020-06-26 DIAGNOSIS — H2513 Age-related nuclear cataract, bilateral: Secondary | ICD-10-CM | POA: Diagnosis not present

## 2020-06-26 DIAGNOSIS — L578 Other skin changes due to chronic exposure to nonionizing radiation: Secondary | ICD-10-CM | POA: Diagnosis not present

## 2020-06-26 DIAGNOSIS — Z86018 Personal history of other benign neoplasm: Secondary | ICD-10-CM | POA: Diagnosis not present

## 2020-08-21 ENCOUNTER — Ambulatory Visit: Payer: Medicare PPO | Admitting: Internal Medicine

## 2020-09-03 ENCOUNTER — Ambulatory Visit: Payer: Medicare Other | Admitting: Internal Medicine

## 2020-09-07 ENCOUNTER — Encounter: Payer: Self-pay | Admitting: Physician Assistant

## 2020-09-07 ENCOUNTER — Ambulatory Visit: Payer: Medicare Other | Admitting: Physician Assistant

## 2020-09-07 DIAGNOSIS — G479 Sleep disorder, unspecified: Secondary | ICD-10-CM

## 2020-09-07 DIAGNOSIS — I10 Essential (primary) hypertension: Secondary | ICD-10-CM

## 2020-09-07 DIAGNOSIS — R300 Dysuria: Secondary | ICD-10-CM | POA: Diagnosis not present

## 2020-09-07 DIAGNOSIS — F419 Anxiety disorder, unspecified: Secondary | ICD-10-CM | POA: Diagnosis not present

## 2020-09-07 LAB — POCT URINALYSIS DIPSTICK
Bilirubin, UA: NEGATIVE
Glucose, UA: NEGATIVE
Ketones, UA: NEGATIVE
Nitrite, UA: POSITIVE
Protein, UA: NEGATIVE
Spec Grav, UA: 1.02 (ref 1.010–1.025)
Urobilinogen, UA: 0.2 E.U./dL
pH, UA: 5 (ref 5.0–8.0)

## 2020-09-07 MED ORDER — CIPROFLOXACIN HCL 500 MG PO TABS: 500.0000 mg | ORAL_TABLET | Freq: Two times a day (BID) | ORAL | 0 refills | Status: AC

## 2020-09-07 MED ORDER — LOSARTAN POTASSIUM 50 MG PO TABS: 50.0000 mg | ORAL_TABLET | Freq: Every day | ORAL | 3 refills | Status: DC

## 2020-09-07 MED ORDER — ESCITALOPRAM OXALATE 5 MG PO TABS: 5.0000 mg | ORAL_TABLET | Freq: Every day | ORAL | 1 refills | Status: DC

## 2020-09-07 NOTE — Progress Notes (Signed)
[facility] [place], [place] [address]  Internal MEDICINE  Office Visit Note  Patient Name: Cindy Duke  [id number]  [id number]  Date of Service: 09/07/2020  Chief Complaint  Patient presents with  . Follow-up    Routine     HPI Pt is here for routine follow up. She is experiencing increased lower abdominal pressure with urination. Denies any burning or discomfort, but does have increased urgency and frequency. She does mention her son is going through a separation and she had to go through traffic to get here that was stressful and may be why her BP is elevated. She does not check BP at home regularly and it has been ok. She is more forgetful, but does not think it has worsened recently. She does have poor sleep quality.  Current Medication: Outpatient Encounter Medications as of 09/07/2020  Medication Sig  . Calcium Carbonate-Vit D-Min (CALCIUM 1200 PO) Take 1 tablet by mouth daily.  . ciprofloxacin (CIPRO) 500 MG tablet Take 1 tablet (500 mg total) by mouth 2 (two) times daily for 7 days.  Marland Kitchen escitalopram (LEXAPRO) 5 MG tablet Take 1 tablet (5 mg total) by mouth daily.  Marland Kitchen losartan (COZAAR) 50 MG tablet Take 1 tablet (50 mg total) by mouth daily.  . Multiple Vitamins-Minerals (MULTIVITAMIN WITH MINERALS) tablet Take 1 tablet by mouth daily.  . Multiple Vitamins-Minerals (ZINC PO) Take by mouth daily.  . [DISCONTINUED] losartan (COZAAR) 25 MG tablet Take one tab for bp in am   No facility-administered encounter medications on file as of 09/07/2020.    Surgical History: Past Surgical History:  Procedure Laterality Date  . BREAST BIOPSY Left    bx/clip-neg  . COLONOSCOPY  2016   Dr Vira Agar- cleared for 5 yrs  . COLONOSCOPY WITH PROPOFOL N/A 03/20/2020   Procedure: COLONOSCOPY WITH PROPOFOL;  Surgeon: Virgel Manifold, MD;  Location: [place];  Service: Endoscopy;  Laterality: N/A;  priority 4  . POLYPECTOMY N/A 03/20/2020    Procedure: POLYPECTOMY;  Surgeon: Virgel Manifold, MD;  Location: [place];  Service: Endoscopy;  Laterality: N/A;  . VAGINAL HYSTERECTOMY    . VAGINAL PROLAPSE REPAIR      Medical History: Past Medical History:  Diagnosis Date  . Hypertension     Family History: Family History  Problem Relation Age of Onset  . Hypertension Mother   . Colon cancer Mother   . Pancreatic cancer Sister   . Breast cancer Neg Hx     Social History   Socioeconomic History  . Marital status: Married    Spouse name: Not on file  . Number of children: Not on file  . Years of education: Not on file  . Highest education level: Not on file  Occupational History  . Not on file  Tobacco Use  . Smoking status: Never Smoker  . Smokeless tobacco: Never Used  Vaping Use  . Vaping Use: Never used  Substance and Sexual Activity  . Alcohol use: No    Alcohol/week: 0.0 standard drinks    Comment: rare wine  . Drug use: No  . Sexual activity: Not on file  Other Topics Concern  . Not on file  Social History Narrative  . Not on file   Social Determinants of Health   Financial Resource Strain: Not on file  Food Insecurity: Not on file  Transportation Needs: Not on file  Physical Activity: Not on file  Stress: Not on file  Social  Connections: Not on file  Intimate Partner Violence: Not on file      Review of Systems  Constitutional: Negative for chills, fatigue and unexpected weight change.  HENT: Positive for postnasal drip. Negative for congestion, rhinorrhea, sneezing and sore throat.   Eyes: Negative for redness.  Respiratory: Negative for cough, chest tightness and shortness of breath.   Cardiovascular: Negative for chest pain and palpitations.  Gastrointestinal: Negative for abdominal pain, constipation, diarrhea, nausea and vomiting.  Genitourinary: Positive for frequency and urgency. Negative for dysuria.       Pelvic pressure with urination  Musculoskeletal: Negative  for arthralgias, back pain, joint swelling and neck pain.  Skin: Negative for rash.  Neurological: Negative.  Negative for tremors and numbness.  Hematological: Negative for adenopathy. Does not bruise/bleed easily.  Psychiatric/Behavioral: Positive for sleep disturbance. Negative for behavioral problems (Depression) and suicidal ideas. The patient is nervous/anxious.     Vital Signs: BP (!) 150/80   Pulse 76   Temp 97.9 F (36.6 C)   Resp 16   Wt 156 lb 12.8 oz (71.1 kg)   SpO2 98%   BMI 23.84 kg/m    Physical Exam Constitutional:      General: She is not in acute distress.    Appearance: She is well-developed. She is not diaphoretic.  HENT:     Head: Normocephalic and atraumatic.     Mouth/Throat:     Pharynx: No oropharyngeal exudate.  Eyes:     Pupils: Pupils are equal, round, and reactive to light.  Neck:     Thyroid: No thyromegaly.     Vascular: No JVD.     Trachea: No tracheal deviation.  Cardiovascular:     Rate and Rhythm: Normal rate and regular rhythm.     Heart sounds: Normal heart sounds. No murmur heard. No friction rub. No gallop.   Pulmonary:     Effort: Pulmonary effort is normal. No respiratory distress.     Breath sounds: No wheezing or rales.  Chest:     Chest wall: No tenderness.  Abdominal:     General: Bowel sounds are normal.     Palpations: Abdomen is soft.  Musculoskeletal:        General: Normal range of motion.     Cervical back: Normal range of motion and neck supple.  Lymphadenopathy:     Cervical: No cervical adenopathy.  Skin:    General: Skin is warm and dry.  Neurological:     Mental Status: She is alert and oriented to person, place, and time.     Cranial Nerves: No cranial nerve deficit.  Psychiatric:        Behavior: Behavior normal.        Thought Content: Thought content normal.        Judgment: Judgment normal.        Assessment/Plan: 1. Essential hypertension BP is still elevated in office. Will have her  double her current Losartan for a total of 50mg  and send new script. - losartan (COZAAR) 50 MG tablet; Take 1 tablet (50 mg total) by mouth daily.  Dispense: 90 tablet; Refill: 3  2. Sleep disturbance Patient has poor sleep quality. Will try 5mg  of Lexapro to help with sleep and anxiety. - escitalopram (LEXAPRO) 5 MG tablet; Take 1 tablet (5 mg total) by mouth daily.  Dispense: 60 tablet; Refill: 1  3. Anxiety Increased anxiety during son's separation. Will start on 5mg  Lexapro to help with both anxiety and sleep. - escitalopram (  LEXAPRO) 5 MG tablet; Take 1 tablet (5 mg total) by mouth daily.  Dispense: 60 tablet; Refill: 1  4. Dysuria - POCT Urinalysis Dipstick shows positive leukocytes and nitrates. Will treat with Cipro and adjust based on culture results. - ciprofloxacin (CIPRO) 500 MG tablet; Take 1 tablet (500 mg total) by mouth 2 (two) times daily for 7 days.  Dispense: 14 tablet; Refill: 0 - CULTURE, URINE COMPREHENSIVE  General Counseling: tytiyana coor understanding of the findings of todays visit and agrees with plan of treatment. I have discussed any further diagnostic evaluation that may be needed or ordered today. We also reviewed her medications today. she has been encouraged to call the office with any questions or concerns that should arise related to todays visit.    Orders Placed This Encounter  Procedures  . CULTURE, URINE COMPREHENSIVE  . POCT Urinalysis Dipstick    Meds ordered this encounter  Medications  . escitalopram (LEXAPRO) 5 MG tablet    Sig: Take 1 tablet (5 mg total) by mouth daily.    Dispense:  60 tablet    Refill:  1  . losartan (COZAAR) 50 MG tablet    Sig: Take 1 tablet (50 mg total) by mouth daily.    Dispense:  90 tablet    Refill:  3  . ciprofloxacin (CIPRO) 500 MG tablet    Sig: Take 1 tablet (500 mg total) by mouth 2 (two) times daily for 7 days.    Dispense:  14 tablet    Refill:  0    Total time spent:30 Minutes Time spent  includes review of chart, medications, test results, and follow up plan with the patient.      Dr Lavera Guise Internal medicine

## 2020-09-12 LAB — CULTURE, URINE COMPREHENSIVE

## 2020-09-15 NOTE — Progress Notes (Signed)
Can you please call the pt and see if she is still having UTI symptoms? If so I will call in an alternative antibiotic based on culture sensitivity.

## 2020-09-17 ENCOUNTER — Other Ambulatory Visit: Payer: Self-pay

## 2020-09-17 ENCOUNTER — Telehealth: Payer: Self-pay

## 2020-09-17 ENCOUNTER — Other Ambulatory Visit: Payer: Self-pay | Admitting: Physician Assistant

## 2020-09-17 DIAGNOSIS — N3000 Acute cystitis without hematuria: Secondary | ICD-10-CM

## 2020-09-17 MED ORDER — NITROFURANTOIN MONOHYD MACRO 100 MG PO CAPS
ORAL_CAPSULE | ORAL | 0 refills | Status: DC
Start: 2020-09-17 — End: 2020-09-17

## 2020-09-17 MED ORDER — NITROFURANTOIN MONOHYD MACRO 100 MG PO CAPS: 100.0000 mg | ORAL_CAPSULE | Freq: Two times a day (BID) | ORAL | 0 refills | Status: DC

## 2020-09-17 NOTE — Telephone Encounter (Signed)
Pt.notified

## 2020-09-17 NOTE — Telephone Encounter (Signed)
-----   Message from Mylinda Latina, PA-C sent at 09/17/2020  2:40 PM EST ----- Please call and let her know that I sent a new antibiotic in for her since her previous antibiotic did not cover the bacteria on culture/sensitivity. Advise her to complete the new antibiotic despite symptom improvement.  Thank you!

## 2020-09-17 NOTE — Telephone Encounter (Signed)
Called pt and she advised that she is not having any symptoms.  I informed her that if she starts feeling like she starts again with any to call us back.

## 2020-10-16 ENCOUNTER — Other Ambulatory Visit (HOSPITAL_COMMUNITY): Payer: Self-pay | Admitting: Otolaryngology

## 2020-10-16 ENCOUNTER — Other Ambulatory Visit: Payer: Self-pay | Admitting: Otolaryngology

## 2020-10-16 DIAGNOSIS — E041 Nontoxic single thyroid nodule: Secondary | ICD-10-CM

## 2020-10-22 ENCOUNTER — Ambulatory Visit: Payer: Medicare PPO

## 2020-11-05 ENCOUNTER — Ambulatory Visit: Payer: Medicare Other | Admitting: Physician Assistant

## 2020-11-05 ENCOUNTER — Encounter: Payer: Self-pay | Admitting: Physician Assistant

## 2020-11-05 ENCOUNTER — Ambulatory Visit
Admission: RE | Admit: 2020-11-05 | Discharge: 2020-11-05 | Disposition: A | Discharge status: Home / Self Care | Payer: Medicare Other | Source: Ambulatory Visit | Attending: Otolaryngology | Admitting: Otolaryngology

## 2020-11-05 ENCOUNTER — Other Ambulatory Visit: Payer: Self-pay

## 2020-11-05 DIAGNOSIS — R300 Dysuria: Secondary | ICD-10-CM

## 2020-11-05 DIAGNOSIS — I10 Essential (primary) hypertension: Secondary | ICD-10-CM

## 2020-11-05 DIAGNOSIS — F419 Anxiety disorder, unspecified: Secondary | ICD-10-CM

## 2020-11-05 DIAGNOSIS — G479 Sleep disorder, unspecified: Secondary | ICD-10-CM | POA: Diagnosis not present

## 2020-11-05 DIAGNOSIS — E041 Nontoxic single thyroid nodule: Secondary | ICD-10-CM | POA: Insufficient documentation

## 2020-11-05 DIAGNOSIS — E042 Nontoxic multinodular goiter: Secondary | ICD-10-CM | POA: Diagnosis not present

## 2020-11-05 LAB — POCT URINALYSIS DIPSTICK
Bilirubin, UA: NEGATIVE
Blood, UA: NEGATIVE
Glucose, UA: NEGATIVE
Ketones, UA: NEGATIVE
Leukocytes, UA: NEGATIVE
Nitrite, UA: NEGATIVE
Protein, UA: NEGATIVE
Spec Grav, UA: 1.01 (ref 1.010–1.025)
Urobilinogen, UA: 0.2 E.U./dL
pH, UA: 5 (ref 5.0–8.0)

## 2020-11-05 MED ORDER — AMLODIPINE BESYLATE 2.5 MG PO TABS: 2.5000 mg | ORAL_TABLET | Freq: Every day | ORAL | 2 refills | Status: DC

## 2020-11-05 NOTE — Progress Notes (Signed)
[facility] [place], [place] [address]  Internal MEDICINE  Office Visit Note  Patient Name: Cindy Duke  [id number]  [id number]  Date of Service: 11/06/2020  Chief Complaint  Patient presents with  . Follow-up    UTI  . Hypertension    HPI Pt is here for f/u -Pt has no more UTI symptoms. -BP at home has been elevated to 150s/70s, but has not taken it the last few weeks bc she was out of town. She is still stressed about son's separation with young kids. Recheck in office increased to 158/68. -Lexapro is helping her sleep better and anxiety is better controlled though she does still have stress over family.  -Had recent thyroid US for nodules that are growing and may need to be removed. Endocrinology is following this, she does not take any thyroid medications currently. They are benign based on bx a few years ago but starting to impact swallowing.  Current Medication: Outpatient Encounter Medications as of 11/05/2020  Medication Sig  . amLODipine (NORVASC) 2.5 MG tablet Take 1 tablet (2.5 mg total) by mouth daily.  . Calcium Carbonate-Vit D-Min (CALCIUM 1200 PO) Take 1 tablet by mouth daily.  Marland Kitchen escitalopram (LEXAPRO) 5 MG tablet Take 1 tablet (5 mg total) by mouth daily.  Marland Kitchen losartan (COZAAR) 50 MG tablet Take 1 tablet (50 mg total) by mouth daily.  . Multiple Vitamins-Minerals (MULTIVITAMIN WITH MINERALS) tablet Take 1 tablet by mouth daily.  . Multiple Vitamins-Minerals (ZINC PO) Take by mouth daily.  . [DISCONTINUED] nitrofurantoin, macrocrystal-monohydrate, (MACROBID) 100 MG capsule Take 1 capsule (100 mg total) by mouth 2 (two) times daily. Use as instructed   No facility-administered encounter medications on file as of 11/05/2020.    Surgical History: Past Surgical History:  Procedure Laterality Date  . BREAST BIOPSY Left    bx/clip-neg  . COLONOSCOPY  2016   Dr Vira Agar- cleared for 5 yrs  . COLONOSCOPY WITH PROPOFOL N/A  03/20/2020   Procedure: COLONOSCOPY WITH PROPOFOL;  Surgeon: Virgel Manifold, MD;  Location: [place];  Service: Endoscopy;  Laterality: N/A;  priority 4  . POLYPECTOMY N/A 03/20/2020   Procedure: POLYPECTOMY;  Surgeon: Virgel Manifold, MD;  Location: [facility] [place];  Service: Endoscopy;  Laterality: N/A;  . VAGINAL HYSTERECTOMY    . VAGINAL PROLAPSE REPAIR      Medical History: Past Medical History:  Diagnosis Date  . Hypertension     Family History: Family History  Problem Relation Age of Onset  . Hypertension Mother   . Colon cancer Mother   . Pancreatic cancer Sister   . Breast cancer Neg Hx     Social History   Socioeconomic History  . Marital status: Married    Spouse name: Not on file  . Number of children: Not on file  . Years of education: Not on file  . Highest education level: Not on file  Occupational History  . Not on file  Tobacco Use  . Smoking status: Never Smoker  . Smokeless tobacco: Never Used  Vaping Use  . Vaping Use: Never used  Substance and Sexual Activity  . Alcohol use: No    Alcohol/week: 0.0 standard drinks    Comment: rare wine  . Drug use: No  . Sexual activity: Not on file  Other Topics Concern  . Not on file  Social History Narrative  . Not on file   Social Determinants of Health   Financial Resource Strain: Not  on file  Food Insecurity: Not on file  Transportation Needs: Not on file  Physical Activity: Not on file  Stress: Not on file  Social Connections: Not on file  Intimate Partner Violence: Not on file      Review of Systems  Constitutional: Negative for chills, fatigue and unexpected weight change.  HENT: Negative for congestion, postnasal drip, rhinorrhea, sneezing and sore throat.   Eyes: Negative for redness and visual disturbance.  Respiratory: Negative for cough, chest tightness and shortness of breath.   Cardiovascular: Negative for chest pain and palpitations.  Gastrointestinal:  Negative for abdominal pain, constipation, diarrhea, nausea and vomiting.  Genitourinary: Negative for dysuria and frequency.  Musculoskeletal: Negative for arthralgias, back pain, joint swelling and neck pain.  Skin: Negative for rash.  Neurological: Negative.  Negative for tremors, numbness and headaches.  Hematological: Negative for adenopathy. Does not bruise/bleed easily.  Psychiatric/Behavioral: Positive for sleep disturbance. Negative for behavioral problems (Depression) and suicidal ideas. The patient is nervous/anxious.     Vital Signs: BP (!) 148/68   Pulse 74   Temp 97.6 F (36.4 C)   Resp 16   Wt 156 lb (70.8 kg)   SpO2 98%   BMI 23.72 kg/m    Physical Exam Vitals and nursing note reviewed.  Constitutional:      General: She is not in acute distress.    Appearance: She is well-developed and normal weight. She is not diaphoretic.  HENT:     Head: Normocephalic and atraumatic.     Mouth/Throat:     Pharynx: No oropharyngeal exudate.  Eyes:     Pupils: Pupils are equal, round, and reactive to light.  Neck:     Thyroid: No thyromegaly.     Vascular: No JVD.     Trachea: No tracheal deviation.  Cardiovascular:     Rate and Rhythm: Normal rate and regular rhythm.     Heart sounds: Normal heart sounds. No murmur heard. No friction rub. No gallop.   Pulmonary:     Effort: Pulmonary effort is normal. No respiratory distress.     Breath sounds: No wheezing or rales.  Chest:     Chest wall: No tenderness.  Abdominal:     General: Bowel sounds are normal. There is no distension.     Palpations: Abdomen is soft.     Tenderness: There is no abdominal tenderness.  Musculoskeletal:        General: Normal range of motion.     Cervical back: Normal range of motion and neck supple.     Right lower leg: No edema.     Left lower leg: No edema.  Lymphadenopathy:     Cervical: No cervical adenopathy.  Skin:    General: Skin is warm and dry.  Neurological:     Mental  Status: She is alert and oriented to person, place, and time.     Cranial Nerves: No cranial nerve deficit.  Psychiatric:        Behavior: Behavior normal.        Thought Content: Thought content normal.        Judgment: Judgment normal.     Comments: Pt is anxious in office        Assessment/Plan: 1. Essential hypertension BP consistently elevated, will continue 50mg  losartan and start on 2.5 mg Norvasc. Pt will monitor closely - amLODipine (NORVASC) 2.5 MG tablet; Take 1 tablet (2.5 mg total) by mouth daily.  Dispense: 30 tablet; Refill: 2  2. Anxiety  Improving, continue Lexapro 5mg . Discussed that we can increase dose in future if it becomes no longer effective, however pt thinks current dose is helping.  3. Sleep disturbance Improving since starting Lexapro, will continue current dose.  4. Dysuria - POCT Urinalysis Dipstick negative and no longer has UTI symptoms.   General Counseling: viveka wilmeth understanding of the findings of todays visit and agrees with plan of treatment. I have discussed any further diagnostic evaluation that may be needed or ordered today. We also reviewed her medications today. she has been encouraged to call the office with any questions or concerns that should arise related to todays visit.  Hypertension Counseling:   The following hypertensive lifestyle modification were recommended and discussed:  1. Limiting alcohol intake to less than 1 oz/day of ethanol:(24 oz of beer or 8 oz of wine or 2 oz of 100-proof whiskey). 2. Take baby ASA 81 mg daily. 3. Importance of regular aerobic exercise and losing weight. 4. Reduce dietary saturated fat and cholesterol intake for overall cardiovascular health. 5. Maintaining adequate dietary potassium, calcium, and magnesium intake. 6. Regular monitoring of the blood pressure. 7. Reduce sodium intake to less than 100 mmol/day (less than 2.3 gm of sodium or less than 6 gm of sodium choride)   Orders  Placed This Encounter  Procedures  . POCT Urinalysis Dipstick    Meds ordered this encounter  Medications  . amLODipine (NORVASC) 2.5 MG tablet    Sig: Take 1 tablet (2.5 mg total) by mouth daily.    Dispense:  30 tablet    Refill:  2    This patient was seen by Drema Dallas, PA-C in collaboration with Dr. Clayborn Bigness as a part of collaborative care agreement.   Total time spent:30 Minutes Time spent includes review of chart, medications, test results, and follow up plan with the patient.      Dr Lavera Guise Internal medicine

## 2020-11-09 DIAGNOSIS — E041 Nontoxic single thyroid nodule: Secondary | ICD-10-CM | POA: Diagnosis not present

## 2020-11-09 DIAGNOSIS — K219 Gastro-esophageal reflux disease without esophagitis: Secondary | ICD-10-CM | POA: Diagnosis not present

## 2020-12-27 ENCOUNTER — Other Ambulatory Visit: Payer: Self-pay | Admitting: Physician Assistant

## 2020-12-27 DIAGNOSIS — F419 Anxiety disorder, unspecified: Secondary | ICD-10-CM

## 2020-12-27 DIAGNOSIS — G479 Sleep disorder, unspecified: Secondary | ICD-10-CM

## 2021-01-10 ENCOUNTER — Ambulatory Visit: Payer: Self-pay | Admitting: Physician Assistant

## 2021-01-17 ENCOUNTER — Other Ambulatory Visit: Payer: Self-pay

## 2021-01-17 ENCOUNTER — Encounter: Payer: Self-pay | Admitting: Nurse Practitioner

## 2021-01-17 ENCOUNTER — Ambulatory Visit: Payer: Medicare Other | Admitting: Nurse Practitioner

## 2021-01-17 VITALS — BP 132/76 | HR 73 | Temp 98.7°F | Resp 16 | Ht 68.0 in | Wt 154.2 lb

## 2021-01-17 DIAGNOSIS — F419 Anxiety disorder, unspecified: Secondary | ICD-10-CM | POA: Diagnosis not present

## 2021-01-17 DIAGNOSIS — E7849 Other hyperlipidemia: Secondary | ICD-10-CM

## 2021-01-17 DIAGNOSIS — I10 Essential (primary) hypertension: Secondary | ICD-10-CM

## 2021-01-17 MED ORDER — AMLODIPINE BESYLATE 2.5 MG PO TABS: 2.5000 mg | ORAL_TABLET | Freq: Every day | ORAL | 2 refills | Status: DC

## 2021-01-17 NOTE — Progress Notes (Signed)
[facility] [place], [place] [address]  Internal MEDICINE  Office Visit Note  Patient Name: Cindy Duke  [id number]  [id number]  Date of Service: 01/22/2021  Chief Complaint  Patient presents with   Hypertension    BP check    HPI Shene presents for a follow-up visit for hypertension and blood pressure check.  She has a history of breast surgery and a hysterectomy.  Her most recent colonoscopy was done in August 2021.  She also has a history of chronic constipation, multiple thyroid nodules, vitamin D deficiency, endometriosis, and mixed hyperlipidemia.  She does have a family history of colon cancer.   -Her blood pressure today is 132/76 which is well controlled on current medications which are losartan 50 mg daily and amlodipine 2.5 mg daily.  She is scheduled for her annual physical exam with Dr. Clayborn Bigness in July next month. She is taking escitalopram 5 mg daily for anxiety which the patient reports that it is working well. She denies having any other questions or concerns today.     Current Medication: Outpatient Encounter Medications as of 01/17/2021  Medication Sig   Calcium Carbonate-Vit D-Min (CALCIUM 1200 PO) Take 1 tablet by mouth daily.   escitalopram (LEXAPRO) 5 MG tablet TAKE (1) TABLET BY MOUTH EVERY DAY   losartan (COZAAR) 50 MG tablet Take 1 tablet (50 mg total) by mouth daily.   Multiple Vitamins-Minerals (MULTIVITAMIN WITH MINERALS) tablet Take 1 tablet by mouth daily.   Multiple Vitamins-Minerals (ZINC PO) Take by mouth daily.   [DISCONTINUED] amLODipine (NORVASC) 2.5 MG tablet Take 1 tablet (2.5 mg total) by mouth daily.   amLODipine (NORVASC) 2.5 MG tablet Take 1 tablet (2.5 mg total) by mouth daily.   No facility-administered encounter medications on file as of 01/17/2021.    Surgical History: Past Surgical History:  Procedure Laterality Date   BREAST BIOPSY Left    bx/clip-neg   COLONOSCOPY  2016   Dr  Vira Agar- cleared for 5 yrs   COLONOSCOPY WITH PROPOFOL N/A 03/20/2020   Procedure: COLONOSCOPY WITH PROPOFOL;  Surgeon: Virgel Manifold, MD;  Location: [place];  Service: Endoscopy;  Laterality: N/A;  priority 4   POLYPECTOMY N/A 03/20/2020   Procedure: POLYPECTOMY;  Surgeon: Virgel Manifold, MD;  Location: [place];  Service: Endoscopy;  Laterality: N/A;   VAGINAL HYSTERECTOMY     VAGINAL PROLAPSE REPAIR      Medical History: Past Medical History:  Diagnosis Date   Hypertension     Family History: Family History  Problem Relation Age of Onset   Hypertension Mother    Colon cancer Mother    Pancreatic cancer Sister    Breast cancer Neg Hx     Social History   Socioeconomic History   Marital status: Married    Spouse name: Not on file   Number of children: Not on file   Years of education: Not on file   Highest education level: Not on file  Occupational History   Not on file  Tobacco Use   Smoking status: Never   Smokeless tobacco: Never  Vaping Use   Vaping Use: Never used  Substance and Sexual Activity   Alcohol use: No    Alcohol/week: 0.0 standard drinks    Comment: rare wine   Drug use: No   Sexual activity: Not on file  Other Topics Concern   Not on file  Social History Narrative   Not on file  Social Determinants of Health   Financial Resource Strain: Not on file  Food Insecurity: Not on file  Transportation Needs: Not on file  Physical Activity: Not on file  Stress: Not on file  Social Connections: Not on file  Intimate Partner Violence: Not on file      Review of Systems  Constitutional:  Negative for chills, fatigue and unexpected weight change.  HENT:  Negative for congestion, rhinorrhea, sneezing and sore throat.   Eyes:  Negative for redness.  Respiratory:  Negative for cough, chest tightness and shortness of breath.   Cardiovascular:  Negative for chest pain and palpitations.  Gastrointestinal:  Negative  for abdominal pain, constipation, diarrhea, nausea and vomiting.  Genitourinary:  Negative for dysuria and frequency.  Musculoskeletal:  Negative for arthralgias, back pain, joint swelling and neck pain.  Skin:  Negative for rash.  Neurological: Negative.  Negative for tremors and numbness.  Hematological:  Negative for adenopathy. Does not bruise/bleed easily.  Psychiatric/Behavioral:  Negative for behavioral problems (Depression), sleep disturbance and suicidal ideas. The patient is not nervous/anxious.    Vital Signs: BP 132/76   Pulse 73   Temp 98.7 F (37.1 C)   Resp 16   Ht 5\' 8"  (1.727 m)   Wt 154 lb 3.2 oz (69.9 kg)   SpO2 96%   BMI 23.45 kg/m    Physical Exam Vitals reviewed.  Constitutional:      General: She is not in acute distress.    Appearance: Normal appearance. She is normal weight. She is not ill-appearing.  HENT:     Head: Normocephalic and atraumatic.  Cardiovascular:     Rate and Rhythm: Normal rate and regular rhythm.     Pulses: Normal pulses.     Heart sounds: Normal heart sounds.  Pulmonary:     Effort: Pulmonary effort is normal.     Breath sounds: Normal breath sounds.  Skin:    General: Skin is warm and dry.     Capillary Refill: Capillary refill takes less than 2 seconds.  Neurological:     Mental Status: She is alert and oriented to person, place, and time.  Psychiatric:        Mood and Affect: Mood normal.        Behavior: Behavior normal.       Assessment/Plan: 1. Essential hypertension Blood pressure stable on current medications, amlodipine refilled.  - amLODipine (NORVASC) 2.5 MG tablet; Take 1 tablet (2.5 mg total) by mouth daily.  Dispense: 30 tablet; Refill: 2  2. Anxiety Stable on current medications, no changes.   3. Other hyperlipidemia History of hyperlipidemia, no currently on any medications, patient will be seen by Dr. Stephanie Coup for annual physical in July 2022, she will have routine labs drawn then.    General  Counseling: anael rosch understanding of the findings of todays visit and agrees with plan of treatment. I have discussed any further diagnostic evaluation that may be needed or ordered today. We also reviewed her medications today. she has been encouraged to call the office with any questions or concerns that should arise related to todays visit.    No orders of the defined types were placed in this encounter.   Meds ordered this encounter  Medications   amLODipine (NORVASC) 2.5 MG tablet    Sig: Take 1 tablet (2.5 mg total) by mouth daily.    Dispense:  30 tablet    Refill:  2    Return for CPE previously scheduled with  [facility] in July. .   Total time spent:30 Minutes Time spent includes review of chart, medications, test results, and follow up plan with the patient.   [place] Controlled Substance Database was reviewed by me.  This patient was seen by Jonetta Osgood, FNP-C in collaboration with Dr. Clayborn Bigness as a part of collaborative care agreement.   Nyasia Baxley R. Valetta Fuller, MSN, FNP-C Internal medicine

## 2021-01-17 NOTE — Progress Notes (Signed)
[facility] [place], [place] [address]  Internal MEDICINE  Office Visit Note  Patient Name: Cindy Duke  [id number]  [id number]  Date of Service: 01/17/2021  Chief Complaint  Patient presents with   Hypertension    BP check    HPI Duplicate     Current Medication: Outpatient Encounter Medications as of 01/17/2021  Medication Sig   amLODipine (NORVASC) 2.5 MG tablet Take 1 tablet (2.5 mg total) by mouth daily.   Calcium Carbonate-Vit D-Min (CALCIUM 1200 PO) Take 1 tablet by mouth daily.   escitalopram (LEXAPRO) 5 MG tablet TAKE (1) TABLET BY MOUTH EVERY DAY   losartan (COZAAR) 50 MG tablet Take 1 tablet (50 mg total) by mouth daily.   Multiple Vitamins-Minerals (MULTIVITAMIN WITH MINERALS) tablet Take 1 tablet by mouth daily.   Multiple Vitamins-Minerals (ZINC PO) Take by mouth daily.   No facility-administered encounter medications on file as of 01/17/2021.    Surgical History: Past Surgical History:  Procedure Laterality Date   BREAST BIOPSY Left    bx/clip-neg   COLONOSCOPY  2016   Dr Vira Agar- cleared for 5 yrs   COLONOSCOPY WITH PROPOFOL N/A 03/20/2020   Procedure: COLONOSCOPY WITH PROPOFOL;  Surgeon: Virgel Manifold, MD;  Location: [place];  Service: Endoscopy;  Laterality: N/A;  priority 4   POLYPECTOMY N/A 03/20/2020   Procedure: POLYPECTOMY;  Surgeon: Virgel Manifold, MD;  Location: [place];  Service: Endoscopy;  Laterality: N/A;   VAGINAL HYSTERECTOMY     VAGINAL PROLAPSE REPAIR      Medical History: Past Medical History:  Diagnosis Date   Hypertension     Family History: Family History  Problem Relation Age of Onset   Hypertension Mother    Colon cancer Mother    Pancreatic cancer Sister    Breast cancer Neg Hx     Social History   Socioeconomic History   Marital status: Married    Spouse name: Not on file   Number of children: Not on file   Years of education: Not on file    Highest education level: Not on file  Occupational History   Not on file  Tobacco Use   Smoking status: Never   Smokeless tobacco: Never  Vaping Use   Vaping Use: Never used  Substance and Sexual Activity   Alcohol use: No    Alcohol/week: 0.0 standard drinks    Comment: rare wine   Drug use: No   Sexual activity: Not on file  Other Topics Concern   Not on file  Social History Narrative   Not on file   Social Determinants of Health   Financial Resource Strain: Not on file  Food Insecurity: Not on file  Transportation Needs: Not on file  Physical Activity: Not on file  Stress: Not on file  Social Connections: Not on file  Intimate Partner Violence: Not on file      Review of Systems  Vital Signs: BP 132/76   Pulse 73   Temp 98.7 F (37.1 C)   Resp 16   Ht 5\' 8"  (1.727 m)   Wt 154 lb 3.2 oz (69.9 kg)   SpO2 96%   BMI 23.45 kg/m    Physical Exam

## 2021-02-25 ENCOUNTER — Telehealth: Payer: Self-pay

## 2021-02-25 NOTE — Telephone Encounter (Signed)
Left vm to screen for 02/26/21 appointment-Toni

## 2021-02-26 ENCOUNTER — Encounter: Payer: Self-pay | Admitting: Physician Assistant

## 2021-02-26 ENCOUNTER — Ambulatory Visit (INDEPENDENT_AMBULATORY_CARE_PROVIDER_SITE_OTHER): Payer: Medicare Other | Admitting: Physician Assistant

## 2021-02-26 ENCOUNTER — Other Ambulatory Visit: Payer: Self-pay

## 2021-02-26 VITALS — BP 138/68 | HR 71 | Temp 98.1°F | Resp 16 | Ht 68.0 in | Wt 153.2 lb

## 2021-02-26 DIAGNOSIS — Z0001 Encounter for general adult medical examination with abnormal findings: Secondary | ICD-10-CM

## 2021-02-26 DIAGNOSIS — F419 Anxiety disorder, unspecified: Secondary | ICD-10-CM

## 2021-02-26 DIAGNOSIS — I10 Essential (primary) hypertension: Secondary | ICD-10-CM | POA: Diagnosis not present

## 2021-02-26 DIAGNOSIS — Z1231 Encounter for screening mammogram for malignant neoplasm of breast: Secondary | ICD-10-CM

## 2021-02-26 DIAGNOSIS — R300 Dysuria: Secondary | ICD-10-CM | POA: Diagnosis not present

## 2021-02-26 DIAGNOSIS — G479 Sleep disorder, unspecified: Secondary | ICD-10-CM

## 2021-02-26 DIAGNOSIS — R5383 Other fatigue: Secondary | ICD-10-CM

## 2021-02-26 MED ORDER — AMLODIPINE BESYLATE 2.5 MG PO TABS: 2.5000 mg | ORAL_TABLET | Freq: Every day | ORAL | 1 refills | Status: DC

## 2021-02-26 NOTE — Progress Notes (Signed)
[facility] [place], [place] [address]  Internal MEDICINE  Office Visit Note  Patient Name: Cindy Duke  [id number]  [id number]  Date of Service: 02/26/2021  Chief Complaint  Patient presents with   Medicare Wellness   Hypertension   Quality Metric Gaps    Hepatitis C screening     HPI Pt is here for routine health maintenance examination and has no complaints today -BP at home at highest 767 systolic but cuff is old and unsure if reading accurately, plans to get a new one. BP and HR stable in office. Requests 90 day supply of norvasc today -Lexapro has been helping, sleeping better now -reports she is followed by a dermatologist for routine skin checks and has had a few places removed -Followed by ENT for thyroid nodules which are being monitored -mentions she had an instance of left ear/tmj pain bvut it has resolved and ear is normal on exam. States she will f/u with her ENT if it recurs, but wanted to double check today -Due for mammogram in October--will go ahead and place order; BMD done in 2020 and has been supplementing calcium and vit D -Due for routine fasting labs  Current Medication: Outpatient Encounter Medications as of 02/26/2021  Medication Sig   Calcium Carbonate-Vit D-Min (CALCIUM 1200 PO) Take 1 tablet by mouth daily.   escitalopram (LEXAPRO) 5 MG tablet TAKE (1) TABLET BY MOUTH EVERY DAY   losartan (COZAAR) 50 MG tablet Take 1 tablet (50 mg total) by mouth daily.   Multiple Vitamins-Minerals (MULTIVITAMIN WITH MINERALS) tablet Take 1 tablet by mouth daily.   Multiple Vitamins-Minerals (ZINC PO) Take by mouth daily.   [DISCONTINUED] amLODipine (NORVASC) 2.5 MG tablet Take 1 tablet (2.5 mg total) by mouth daily.   amLODipine (NORVASC) 2.5 MG tablet Take 1 tablet (2.5 mg total) by mouth daily.   No facility-administered encounter medications on file as of 02/26/2021.    Surgical History: Past Surgical History:   Procedure Laterality Date   BREAST BIOPSY Left    bx/clip-neg   COLONOSCOPY  2016   Dr Vira Agar- cleared for 5 yrs   COLONOSCOPY WITH PROPOFOL N/A 03/20/2020   Procedure: COLONOSCOPY WITH PROPOFOL;  Surgeon: Virgel Manifold, MD;  Location: [place];  Service: Endoscopy;  Laterality: N/A;  priority 4   POLYPECTOMY N/A 03/20/2020   Procedure: POLYPECTOMY;  Surgeon: Virgel Manifold, MD;  Location: [place];  Service: Endoscopy;  Laterality: N/A;   VAGINAL HYSTERECTOMY     VAGINAL PROLAPSE REPAIR      Medical History: Past Medical History:  Diagnosis Date   Hypertension     Family History: Family History  Problem Relation Age of Onset   Hypertension Mother    Colon cancer Mother    Pancreatic cancer Sister    Breast cancer Neg Hx       Review of Systems  Constitutional:  Negative for chills, fatigue and unexpected weight change.  HENT:  Negative for congestion, postnasal drip, rhinorrhea, sneezing and sore throat.   Eyes:  Negative for redness.  Respiratory:  Negative for cough, chest tightness and shortness of breath.   Cardiovascular:  Negative for chest pain and palpitations.  Gastrointestinal:  Negative for abdominal pain, constipation, diarrhea, nausea and vomiting.  Genitourinary:  Negative for dysuria and frequency.  Musculoskeletal:  Negative for arthralgias, back pain, joint swelling and neck pain.  Skin:  Negative for rash.  Neurological: Negative.  Negative for tremors and numbness.  Hematological:  Negative for adenopathy. Does not bruise/bleed easily.  Psychiatric/Behavioral:  Negative for behavioral problems (Depression), sleep disturbance and suicidal ideas. The patient is not nervous/anxious.     Vital Signs: BP 138/68 Comment: 144/72  Pulse 71   Temp 98.1 F (36.7 C)   Resp 16   Ht 5\' 8"  (1.727 m)   Wt 153 lb 3.2 oz (69.5 kg)   SpO2 98%   BMI 23.29 kg/m    Physical Exam Vitals and nursing note reviewed.   Constitutional:      General: She is not in acute distress.    Appearance: She is well-developed and normal weight. She is not diaphoretic.  HENT:     Head: Normocephalic and atraumatic.     Right Ear: Tympanic membrane and external ear normal.     Left Ear: Tympanic membrane and external ear normal.     Nose: Nose normal.     Mouth/Throat:     Pharynx: No oropharyngeal exudate.  Eyes:     General: No scleral icterus.       Right eye: No discharge.        Left eye: No discharge.     Conjunctiva/sclera: Conjunctivae normal.     Pupils: Pupils are equal, round, and reactive to light.  Neck:     Thyroid: No thyromegaly.     Vascular: No JVD.     Trachea: No tracheal deviation.  Cardiovascular:     Rate and Rhythm: Normal rate and regular rhythm.     Heart sounds: Normal heart sounds. No murmur heard.   No friction rub. No gallop.  Pulmonary:     Effort: Pulmonary effort is normal. No respiratory distress.     Breath sounds: Normal breath sounds. No stridor. No wheezing or rales.  Chest:     Chest wall: No tenderness.  Abdominal:     General: Bowel sounds are normal. There is no distension.     Palpations: Abdomen is soft. There is no mass.     Tenderness: There is no abdominal tenderness. There is no guarding or rebound.  Musculoskeletal:        General: No tenderness or deformity. Normal range of motion.     Cervical back: Normal range of motion and neck supple.  Lymphadenopathy:     Cervical: No cervical adenopathy.  Skin:    General: Skin is warm and dry.     Coloration: Skin is not pale.     Findings: No erythema or rash.  Neurological:     Mental Status: She is alert.     Cranial Nerves: No cranial nerve deficit.     Motor: No abnormal muscle tone.     Coordination: Coordination normal.     Deep Tendon Reflexes: Reflexes are normal and symmetric.  Psychiatric:        Behavior: Behavior normal.        Thought Content: Thought content normal.        Judgment:  Judgment normal.     LABS: No results found for this or any previous visit (from the past 2160 hour(s)).      Assessment/Plan: 1. Encounter for general adult medical examination with abnormal findings Due for mammogram in Oct, BMD done in 2020, CPE performed and routine labs ordered  2. Essential hypertension Stable, continue norvasc and losartan and monitor at home - amLODipine (NORVASC) 2.5 MG tablet; Take 1 tablet (2.5 mg total) by mouth daily.  Dispense: 90 tablet; Refill: 1  3. Anxiety  Stable, continue Lexapro  4. Sleep disturbance Improved, continue lexapro  5. Encounter for screening mammogram for breast cancer - MM 3D SCREEN BREAST BILATERAL; Future  6. Other fatigue - CBC w/Diff/Platelet - Comprehensive metabolic panel - Lipid Panel With LDL/HDL Ratio - TSH + free T4 - B12 and Folate Panel   7. Dysuria -UA w/rflx  General Counseling: Cindy Duke understanding of the findings of todays visit and agrees with plan of treatment. I have discussed any further diagnostic evaluation that may be needed or ordered today. We also reviewed her medications today. she has been encouraged to call the office with any questions or concerns that should arise related to todays visit.    Counseling:    Orders Placed This Encounter  Procedures   MM 3D SCREEN BREAST BILATERAL   CBC w/Diff/Platelet   Comprehensive metabolic panel   Lipid Panel With LDL/HDL Ratio   TSH + free T4   B12 and Folate Panel   UA/M w/rflx Culture, Routine     Meds ordered this encounter  Medications   amLODipine (NORVASC) 2.5 MG tablet    Sig: Take 1 tablet (2.5 mg total) by mouth daily.    Dispense:  90 tablet    Refill:  1     This patient was seen by Drema Dallas, PA-C in collaboration with Dr. Clayborn Bigness as a part of collaborative care agreement.  Total time spent:35 Minutes  Time spent includes review of chart, medications, test results, and follow up plan with the  patient.     Lavera Guise, MD  Internal Medicine

## 2021-02-27 DIAGNOSIS — R5383 Other fatigue: Secondary | ICD-10-CM | POA: Diagnosis not present

## 2021-02-28 LAB — CBC WITH DIFFERENTIAL/PLATELET
Basophils Absolute: 0.1 10*3/uL (ref 0.0–0.2)
Basos: 1 %
EOS (ABSOLUTE): 0.1 10*3/uL (ref 0.0–0.4)
Eos: 3 %
Hematocrit: 41.8 % (ref 34.0–46.6)
Hemoglobin: 13.7 g/dL (ref 11.1–15.9)
Immature Grans (Abs): 0 10*3/uL (ref 0.0–0.1)
Immature Granulocytes: 0 %
Lymphocytes Absolute: 1 10*3/uL (ref 0.7–3.1)
Lymphs: 22 %
MCH: 30.3 pg (ref 26.6–33.0)
MCHC: 32.8 g/dL (ref 31.5–35.7)
MCV: 93 fL (ref 79–97)
Monocytes Absolute: 0.4 10*3/uL (ref 0.1–0.9)
Monocytes: 9 %
Neutrophils Absolute: 3 10*3/uL (ref 1.4–7.0)
Neutrophils: 65 %
Platelets: 214 10*3/uL (ref 150–450)
RBC: 4.52 x10E6/uL (ref 3.77–5.28)
RDW: 12.1 % (ref 11.7–15.4)
WBC: 4.6 10*3/uL (ref 3.4–10.8)

## 2021-02-28 LAB — COMPREHENSIVE METABOLIC PANEL
ALT: 14 IU/L (ref 0–32)
AST: 15 IU/L (ref 0–40)
Albumin/Globulin Ratio: 2.2 (ref 1.2–2.2)
Albumin: 4.4 g/dL (ref 3.7–4.7)
Alkaline Phosphatase: 107 IU/L (ref 44–121)
BUN/Creatinine Ratio: 23 (ref 12–28)
BUN: 16 mg/dL (ref 8–27)
Bilirubin Total: 0.3 mg/dL (ref 0.0–1.2)
CO2: 22 mmol/L (ref 20–29)
Calcium: 10.4 mg/dL — ABNORMAL HIGH (ref 8.7–10.3)
Chloride: 107 mmol/L — ABNORMAL HIGH (ref 96–106)
Creatinine, Ser: 0.69 mg/dL (ref 0.57–1.00)
Globulin, Total: 2 g/dL (ref 1.5–4.5)
Glucose: 94 mg/dL (ref 65–99)
Potassium: 5 mmol/L (ref 3.5–5.2)
Sodium: 141 mmol/L (ref 134–144)
Total Protein: 6.4 g/dL (ref 6.0–8.5)
eGFR: 90 mL/min/{1.73_m2} (ref >59)

## 2021-02-28 LAB — B12 AND FOLATE PANEL
Folate: >20 ng/mL (ref >3.0)
Vitamin B-12: 883 pg/mL (ref 232–1245)

## 2021-02-28 LAB — LIPID PANEL WITH LDL/HDL RATIO
Cholesterol, Total: 189 mg/dL (ref 100–199)
HDL: 46 mg/dL (ref >39)
LDL Chol Calc (NIH): 125 mg/dL — ABNORMAL HIGH (ref 0–99)
LDL/HDL Ratio: 2.7 ratio (ref 0.0–3.2)
Triglycerides: 101 mg/dL (ref 0–149)
VLDL Cholesterol Cal: 18 mg/dL (ref 5–40)

## 2021-02-28 LAB — TSH+FREE T4
Free T4: 0.96 ng/dL (ref 0.82–1.77)
TSH: 1.52 u[IU]/mL (ref 0.450–4.500)

## 2021-03-02 LAB — UA/M W/RFLX CULTURE, ROUTINE
Bilirubin, UA: NEGATIVE
Glucose, UA: NEGATIVE
Ketones, UA: NEGATIVE
Nitrite, UA: POSITIVE — AB
Protein,UA: NEGATIVE
RBC, UA: NEGATIVE
Specific Gravity, UA: 1.016 (ref 1.005–1.030)
Urobilinogen, Ur: 0.2 mg/dL (ref 0.2–1.0)
pH, UA: 5.5 (ref 5.0–7.5)

## 2021-03-02 LAB — MICROSCOPIC EXAMINATION
Casts: NONE SEEN /lpf
RBC, Urine: NONE SEEN /hpf (ref 0–2)

## 2021-03-02 LAB — URINE CULTURE, REFLEX

## 2021-03-04 ENCOUNTER — Telehealth: Payer: Self-pay

## 2021-03-04 NOTE — Telephone Encounter (Signed)
-----   Message from Mylinda Latina, PA-C sent at 03/03/2021  1:36 PM EDT ----- Elevated calcium--please tell patient she should stop calcium supplement at this time. Also let her know her cholesterol is a little elevated and she should avoid any fried/fatty foods. We can discuss further at her next visit.

## 2021-03-04 NOTE — Telephone Encounter (Signed)
Spoke with pt, informed her of her Elevated calcium--told patient she should stop calcium supplement at this time. Also informed her that  her cholesterol is a little elevated and she should avoid any fried/fatty foods and labs can be discuss further at her next visit.

## 2021-03-18 ENCOUNTER — Other Ambulatory Visit: Payer: Self-pay | Admitting: Physician Assistant

## 2021-03-18 ENCOUNTER — Telehealth: Payer: Self-pay

## 2021-03-18 DIAGNOSIS — R300 Dysuria: Secondary | ICD-10-CM

## 2021-03-18 MED ORDER — NITROFURANTOIN MONOHYD MACRO 100 MG PO CAPS: ORAL_CAPSULE | ORAL | 0 refills | Status: DC

## 2021-03-18 NOTE — Telephone Encounter (Signed)
Spoke with pt, pt aware her urine culture did show some growth again and macrobid was sent to the pharmacy for her.  Pt will pick up meds on Wednesday, she also has covid but says she is fine.

## 2021-03-22 ENCOUNTER — Telehealth: Payer: Self-pay

## 2021-03-22 NOTE — Telephone Encounter (Signed)
Pt  called that she having side effect of Macrobid diarrhea and she don't have any symptoms for UTI so I advised her as per lauren that stopped macrobid and if she had symptoms come back she can call  and make appt and we can recheck her urine

## 2021-05-06 ENCOUNTER — Encounter: Payer: Self-pay | Admitting: Physician Assistant

## 2021-05-06 ENCOUNTER — Ambulatory Visit
Admission: RE | Admit: 2021-05-06 | Discharge: 2021-05-06 | Disposition: A | Discharge status: Home / Self Care | Payer: Medicare Other | Attending: Physician Assistant | Admitting: Physician Assistant

## 2021-05-06 ENCOUNTER — Other Ambulatory Visit: Payer: Self-pay

## 2021-05-06 ENCOUNTER — Ambulatory Visit: Payer: Medicare Other | Admitting: Physician Assistant

## 2021-05-06 ENCOUNTER — Ambulatory Visit
Admission: RE | Admit: 2021-05-06 | Discharge: 2021-05-06 | Disposition: A | Discharge status: Home / Self Care | Payer: Medicare Other | Source: Ambulatory Visit | Attending: Physician Assistant | Admitting: Physician Assistant

## 2021-05-06 VITALS — BP 140/71 | HR 76 | Temp 97.8°F | Resp 16 | Ht 68.0 in | Wt 156.0 lb

## 2021-05-06 DIAGNOSIS — M25551 Pain in right hip: Secondary | ICD-10-CM

## 2021-05-06 DIAGNOSIS — M5441 Lumbago with sciatica, right side: Secondary | ICD-10-CM

## 2021-05-06 MED ORDER — GABAPENTIN 100 MG PO CAPS
100.0000 mg | ORAL_CAPSULE | Freq: Three times a day (TID) | ORAL | 0 refills | Status: DC
Start: 2021-05-06 — End: 2021-06-27

## 2021-05-06 NOTE — Progress Notes (Signed)
[facility] [place], [place] [address]  Internal MEDICINE  Office Visit Note  Patient Name: Cindy Duke  [id number]  [id number]  Date of Service: 05/08/2021  Chief Complaint  Patient presents with   Acute Visit   Sciatica    Right leg     HPI Pt is here for a sick visit. -Low back pain for the last month. Does know that she went to the beach for a long car ride without stopping and thinks this may have impacted it, but no other inciting event.  Only hurts on the right side with numbness and tingling down the right leg -Has tried ibuprofen, did not help much -Sleep has been fine. Actually the best day as far as her pain was this morning but has emerged again throughout the day. -difficulty walking, affecting rising from a chair too. Able to bend and twist. -Denies any falls or syncope -pain in back / side of right hip with some TTP, +SLR  Current Medication:  Outpatient Encounter Medications as of 05/06/2021  Medication Sig   amLODipine (NORVASC) 2.5 MG tablet Take 1 tablet (2.5 mg total) by mouth daily.   escitalopram (LEXAPRO) 5 MG tablet TAKE (1) TABLET BY MOUTH EVERY DAY   gabapentin (NEURONTIN) 100 MG capsule Take 1 capsule (100 mg total) by mouth 3 (three) times daily.   losartan (COZAAR) 50 MG tablet Take 1 tablet (50 mg total) by mouth daily.   Multiple Vitamins-Minerals (MULTIVITAMIN WITH MINERALS) tablet Take 1 tablet by mouth daily.   Multiple Vitamins-Minerals (ZINC PO) Take by mouth daily.   [DISCONTINUED] Calcium Carbonate-Vit D-Min (CALCIUM 1200 PO) Take 1 tablet by mouth daily.   [DISCONTINUED] nitrofurantoin, macrocrystal-monohydrate, (MACROBID) 100 MG capsule Take 1 cap twice per day for 10 days.   No facility-administered encounter medications on file as of 05/06/2021.      Medical History: Past Medical History:  Diagnosis Date   Hypertension      Vital Signs: BP 140/71   Pulse 76   Temp 97.8 F (36.6 C)    Resp 16   Ht 5\' 8"  (1.727 m)   Wt 156 lb (70.8 kg)   SpO2 98%   BMI 23.72 kg/m    Review of Systems  Constitutional:  Negative for fatigue and fever.  HENT:  Negative for congestion, mouth sores and postnasal drip.   Respiratory:  Negative for cough.   Cardiovascular:  Negative for chest pain.  Genitourinary:  Negative for flank pain.  Musculoskeletal:  Positive for back pain and gait problem.       Right low back and hip pain with radiation down right leg  Neurological:  Positive for numbness. Negative for dizziness and syncope.  Psychiatric/Behavioral: Negative.     Physical Exam Vitals and nursing note reviewed.  Constitutional:      General: She is not in acute distress.    Appearance: She is well-developed and normal weight. She is not diaphoretic.  HENT:     Head: Normocephalic and atraumatic.     Mouth/Throat:     Pharynx: No oropharyngeal exudate.  Eyes:     Pupils: Pupils are equal, round, and reactive to light.  Neck:     Thyroid: No thyromegaly.     Vascular: No JVD.     Trachea: No tracheal deviation.  Cardiovascular:     Rate and Rhythm: Normal rate and regular rhythm.     Heart sounds: Normal heart sounds. No murmur heard.   No  friction rub. No gallop.  Pulmonary:     Effort: Pulmonary effort is normal. No respiratory distress.     Breath sounds: No wheezing or rales.  Chest:     Chest wall: No tenderness.  Abdominal:     General: Bowel sounds are normal.     Palpations: Abdomen is soft.  Musculoskeletal:        General: Tenderness present. Normal range of motion.     Cervical back: Normal range of motion and neck supple.     Comments: TTP on back/side of right hip, +SLR on right, full ROM with some discomfort/pulling sensation  Lymphadenopathy:     Cervical: No cervical adenopathy.  Skin:    General: Skin is warm and dry.  Neurological:     Mental Status: She is alert and oriented to person, place, and time.     Cranial Nerves: No cranial  nerve deficit.     Gait: Gait abnormal.  Psychiatric:        Behavior: Behavior normal.        Thought Content: Thought content normal.        Judgment: Judgment normal.      Assessment/Plan: 1. Acute hip pain, right Will order xray of rip since tenderness along outside of hip with some discomfort with ROM. May continue ibuprofen and try gabapentin beginning with 1 tab in evening. May need ortho pending results/response to treatment - DG Hip Unilat W OR W/O Pelvis 2-3 Views Right; Future - gabapentin (NEURONTIN) 100 MG capsule; Take 1 capsule (100 mg total) by mouth 3 (three) times daily.  Dispense: 90 capsule; Refill: 0  2. Acute right-sided low back pain with right-sided sciatica Will obtain hip xray and start on gabapentin due to radiation down right leg. May start with 1 tab gabapentin in evening and then may increase to TID if needed. May need ortho pending results/response to treatment   General Counseling: malayzia laforte understanding of the findings of todays visit and agrees with plan of treatment. I have discussed any further diagnostic evaluation that may be needed or ordered today. We also reviewed her medications today. she has been encouraged to call the office with any questions or concerns that should arise related to todays visit.    Counseling:    Orders Placed This Encounter  Procedures   DG Hip Unilat W OR W/O Pelvis 2-3 Views Right    Meds ordered this encounter  Medications   gabapentin (NEURONTIN) 100 MG capsule    Sig: Take 1 capsule (100 mg total) by mouth 3 (three) times daily.    Dispense:  90 capsule    Refill:  0    Time spent:35 Minutes

## 2021-05-27 ENCOUNTER — Other Ambulatory Visit: Payer: Self-pay

## 2021-05-27 ENCOUNTER — Ambulatory Visit
Admission: RE | Admit: 2021-05-27 | Discharge: 2021-05-27 | Disposition: A | Discharge status: Home / Self Care | Payer: Medicare Other | Source: Ambulatory Visit | Attending: Physician Assistant | Admitting: Physician Assistant

## 2021-05-27 DIAGNOSIS — R5383 Other fatigue: Secondary | ICD-10-CM

## 2021-05-27 DIAGNOSIS — Z0001 Encounter for general adult medical examination with abnormal findings: Secondary | ICD-10-CM

## 2021-05-27 DIAGNOSIS — F419 Anxiety disorder, unspecified: Secondary | ICD-10-CM | POA: Diagnosis not present

## 2021-05-27 DIAGNOSIS — I10 Essential (primary) hypertension: Secondary | ICD-10-CM | POA: Diagnosis not present

## 2021-05-27 DIAGNOSIS — Z1231 Encounter for screening mammogram for malignant neoplasm of breast: Secondary | ICD-10-CM | POA: Diagnosis not present

## 2021-05-27 DIAGNOSIS — R300 Dysuria: Secondary | ICD-10-CM

## 2021-05-27 DIAGNOSIS — G479 Sleep disorder, unspecified: Secondary | ICD-10-CM

## 2021-05-27 MED ORDER — ESCITALOPRAM OXALATE 5 MG PO TABS: ORAL_TABLET | ORAL | 1 refills | Status: DC

## 2021-06-26 DIAGNOSIS — Z86018 Personal history of other benign neoplasm: Secondary | ICD-10-CM | POA: Diagnosis not present

## 2021-06-26 DIAGNOSIS — L578 Other skin changes due to chronic exposure to nonionizing radiation: Secondary | ICD-10-CM | POA: Diagnosis not present

## 2021-06-26 DIAGNOSIS — L218 Other seborrheic dermatitis: Secondary | ICD-10-CM | POA: Diagnosis not present

## 2021-06-27 ENCOUNTER — Ambulatory Visit: Payer: Medicare Other | Admitting: Physician Assistant

## 2021-06-27 ENCOUNTER — Encounter: Payer: Self-pay | Admitting: Physician Assistant

## 2021-06-27 ENCOUNTER — Other Ambulatory Visit: Payer: Self-pay

## 2021-06-27 DIAGNOSIS — F419 Anxiety disorder, unspecified: Secondary | ICD-10-CM

## 2021-06-27 DIAGNOSIS — I10 Essential (primary) hypertension: Secondary | ICD-10-CM | POA: Diagnosis not present

## 2021-06-27 DIAGNOSIS — M25551 Pain in right hip: Secondary | ICD-10-CM | POA: Diagnosis not present

## 2021-06-27 DIAGNOSIS — E7849 Other hyperlipidemia: Secondary | ICD-10-CM

## 2021-06-27 DIAGNOSIS — R0989 Other specified symptoms and signs involving the circulatory and respiratory systems: Secondary | ICD-10-CM | POA: Diagnosis not present

## 2021-06-27 DIAGNOSIS — G479 Sleep disorder, unspecified: Secondary | ICD-10-CM

## 2021-06-27 DIAGNOSIS — G8929 Other chronic pain: Secondary | ICD-10-CM

## 2021-06-27 NOTE — Progress Notes (Signed)
[facility] [place], [place] [address]  Internal MEDICINE  Office Visit Note  Patient Name: Cindy Duke  [id number]  [id number]  Date of Service: 06/27/2021  Chief Complaint  Patient presents with   Follow-up   Hypertension    HPI Pt is here for routine follow up -Right hip still bothers her some, but is a little better. Stopped gabapentin after having an instance of passing out one morning a few weeks into medication and did not help much anyways. No recurrence of incident since then and denies any injury or hitting her head. Has been doing some exercises that help some. 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 Pt declines ortho referral at this time and wants to keep trying exercises for now. -Goes to ENT for following thyroid nodules, thyroid labs normal -Discussed elevated cholesterol further and wants to continue to work on diet and exercise. States she had a carotid US a very long time ago, but is ok with repeating. Would like to receive the carotid US results over the phone. -BP at home sometimes 140s, will monitor closely  Current Medication: Outpatient Encounter Medications as of 06/27/2021  Medication Sig   amLODipine (NORVASC) 2.5 MG tablet Take 1 tablet (2.5 mg total) by mouth daily.   escitalopram (LEXAPRO) 5 MG tablet TAKE (1) TABLET BY MOUTH EVERY DAY   losartan (COZAAR) 50 MG tablet Take 1 tablet (50 mg total) by mouth daily.   Multiple Vitamins-Minerals (MULTIVITAMIN WITH MINERALS) tablet Take 1 tablet by mouth daily.   Multiple Vitamins-Minerals (ZINC PO) Take by mouth daily.   [DISCONTINUED] gabapentin (NEURONTIN) 100 MG capsule Take 1 capsule (100 mg total) by mouth 3 (three) times daily.   No facility-administered encounter medications on file as of 06/27/2021.    Surgical History: Past Surgical History:  Procedure Laterality Date   BREAST BIOPSY Left     bx/clip-neg   COLONOSCOPY  2016   Dr Vira Agar- cleared for 5 yrs   COLONOSCOPY WITH PROPOFOL N/A 03/20/2020   Procedure: COLONOSCOPY WITH PROPOFOL;  Surgeon: Virgel Manifold, MD;  Location: [place];  Service: Endoscopy;  Laterality: N/A;  priority 4   POLYPECTOMY N/A 03/20/2020   Procedure: POLYPECTOMY;  Surgeon: Virgel Manifold, MD;  Location: [place];  Service: Endoscopy;  Laterality: N/A;   VAGINAL HYSTERECTOMY     VAGINAL PROLAPSE REPAIR      Medical History: Past Medical History:  Diagnosis Date   Hypertension     Family History: Family History  Problem Relation Age of Onset   Hypertension Mother    Colon cancer Mother    Pancreatic cancer Sister    Breast cancer Neg Hx     Social History   Socioeconomic History   Marital status: Married    Spouse name: Not on file   Number of children: Not on file   Years of education: Not on file   Highest education level: Not on file  Occupational History   Not on file  Tobacco Use   Smoking status: Never   Smokeless tobacco: Never  Vaping Use   Vaping Use: Never used  Substance and Sexual Activity   Alcohol use: No    Alcohol/week: 0.0 standard drinks    Comment: rare wine   Drug use: No   Sexual activity: Not on file  Other Topics Concern   Not on file  Social History Narrative  Not on file   Social Determinants of Health   Financial Resource Strain: Not on file  Food Insecurity: Not on file  Transportation Needs: Not on file  Physical Activity: Not on file  Stress: Not on file  Social Connections: Not on file  Intimate Partner Violence: Not on file      Review of Systems  Constitutional:  Negative for chills, fatigue and unexpected weight change.  HENT:  Negative for congestion, postnasal drip, rhinorrhea, sneezing and sore throat.   Eyes:  Negative for redness.  Respiratory:  Negative for cough, chest tightness and shortness of breath.   Cardiovascular:  Negative for  chest pain and palpitations.  Gastrointestinal:  Negative for abdominal pain, constipation, diarrhea, nausea and vomiting.  Genitourinary:  Negative for dysuria and frequency.  Musculoskeletal:  Positive for arthralgias. Negative for back pain, joint swelling and neck pain.       Right hip pain  Skin:  Negative for rash.  Neurological: Negative.  Negative for tremors and numbness.  Hematological:  Negative for adenopathy. Does not bruise/bleed easily.  Psychiatric/Behavioral:  Negative for behavioral problems (Depression), sleep disturbance and suicidal ideas. The patient is not nervous/anxious.    Vital Signs: BP 140/77   Pulse 77   Temp 97.8 F (36.6 C)   Resp 16   Ht 5\' 8"  (1.727 m)   Wt 156 lb (70.8 kg)   SpO2 96%   BMI 23.72 kg/m    Physical Exam Vitals and nursing note reviewed.  Constitutional:      General: She is not in acute distress.    Appearance: She is well-developed and normal weight. She is not diaphoretic.  HENT:     Head: Normocephalic and atraumatic.     Mouth/Throat:     Pharynx: No oropharyngeal exudate.  Eyes:     Pupils: Pupils are equal, round, and reactive to light.  Neck:     Thyroid: No thyromegaly.     Vascular: No JVD.     Trachea: No tracheal deviation.  Cardiovascular:     Rate and Rhythm: Normal rate and regular rhythm.     Heart sounds: Normal heart sounds. No murmur heard.   No friction rub. No gallop.  Pulmonary:     Effort: Pulmonary effort is normal. No respiratory distress.     Breath sounds: No wheezing or rales.  Chest:     Chest wall: No tenderness.  Abdominal:     General: Bowel sounds are normal.     Palpations: Abdomen is soft.  Musculoskeletal:        General: Normal range of motion.     Cervical back: Normal range of motion and neck supple.  Lymphadenopathy:     Cervical: No cervical adenopathy.  Skin:    General: Skin is warm and dry.  Neurological:     Mental Status: She is alert and oriented to person, place,  and time.     Cranial Nerves: No cranial nerve deficit.  Psychiatric:        Behavior: Behavior normal.        Thought Content: Thought content normal.        Judgment: Judgment normal.       Assessment/Plan: 1. Essential hypertension Stable, continue current medications  2. Chronic hip pain, right Continue exercises, patient will call if worsening and desire to see ortho  3. Bilateral carotid bruits - US Carotid Duplex Bilateral; Future  4. Other hyperlipidemia Declines statin, will continue to work on diet and  exercise and carotid US will be checked  5. Anxiety Stable, continue lexapro  6. Sleep disturbance Stable, continue lexapro   General Counseling: sammie schermerhorn understanding of the findings of todays visit and agrees with plan of treatment. I have discussed any further diagnostic evaluation that may be needed or ordered today. We also reviewed her medications today. she has been encouraged to call the office with any questions or concerns that should arise related to todays visit.    Orders Placed This Encounter  Procedures   US Carotid Duplex Bilateral    No orders of the defined types were placed in this encounter.   This patient was seen by Drema Dallas, PA-C in collaboration with Dr. Clayborn Bigness as a part of collaborative care agreement.   Total time spent:30 Minutes Time spent includes review of chart, medications, test results, and follow up plan with the patient.      Dr Lavera Guise Internal medicine

## 2021-07-10 ENCOUNTER — Other Ambulatory Visit: Payer: Medicare Other

## 2021-07-24 ENCOUNTER — Ambulatory Visit: Payer: Medicare Other

## 2021-07-24 ENCOUNTER — Other Ambulatory Visit: Payer: Self-pay

## 2021-07-24 DIAGNOSIS — R0989 Other specified symptoms and signs involving the circulatory and respiratory systems: Secondary | ICD-10-CM

## 2021-07-29 DIAGNOSIS — M79672 Pain in left foot: Secondary | ICD-10-CM | POA: Diagnosis not present

## 2021-07-29 DIAGNOSIS — M2012 Hallux valgus (acquired), left foot: Secondary | ICD-10-CM | POA: Diagnosis not present

## 2021-07-29 DIAGNOSIS — L03031 Cellulitis of right toe: Secondary | ICD-10-CM | POA: Diagnosis not present

## 2021-07-29 DIAGNOSIS — M2042 Other hammer toe(s) (acquired), left foot: Secondary | ICD-10-CM | POA: Diagnosis not present

## 2021-08-13 ENCOUNTER — Telehealth: Payer: Self-pay | Admitting: Physician Assistant

## 2021-08-14 DIAGNOSIS — L03031 Cellulitis of right toe: Secondary | ICD-10-CM | POA: Diagnosis not present

## 2021-08-14 DIAGNOSIS — M2042 Other hammer toe(s) (acquired), left foot: Secondary | ICD-10-CM | POA: Diagnosis not present

## 2021-08-14 DIAGNOSIS — M2012 Hallux valgus (acquired), left foot: Secondary | ICD-10-CM | POA: Diagnosis not present

## 2021-08-14 NOTE — Telephone Encounter (Signed)
error 

## 2021-08-27 NOTE — Procedures (Signed)
[place], [place] [address]  DATE OF SERVICE: July 24, 2021  CAROTID DOPPLER INTERPRETATION:  Bilateral Carotid Ultrsasound and Color Doppler Examination was performed. The RIGHT CCA shows no significant plaque in the vessel. The LEFT CCA shows no significant plaque in the vessel. There was no intimal thickening noted in the RIGHT carotid artery. There was no intimal thickening in the LEFT carotid artery.  The RIGHT CCA shows peak systolic velocity of 301 cm per second. The end diastolic velocity is 19 cm per second on the RIGHT side. The RIGHT ICA shows peak systolic velocity of 601 per second. RIGHT sided ICA end diastolic velocity is 33 cm per second. The RIGHT ECA shows a peak systolic velocity of 093 cm per second. The ICA/CCA ratio is calculated to be 1.08. This suggests less than 50% stenosis. The Vertebral Artery shows antegrade flow.  The LEFT CCA shows peak systolic velocity of 235 cm per second. The end diastolic velocity is 16 cm per second on the LEFT side. The LEFT ICA shows peak systolic velocity of 573 per second. LEFT sided ICA end diastolic velocity is 40 cm per second. The LEFT ECA shows a peak systolic velocity of 80 cm per second. The ICA/CCA ratio is calculated to be 1.08. This suggests less than 50% stenosis. The Vertebral Artery shows antegrade flow.   Impression:    The RIGHT CAROTID shows less than 50% stenosis. The LEFT CAROTID shows less than 50% stenosis.  There is insignificant plaque formation noted on the LEFT and in significant plaque on the RIGHT  side. Consider a repeat Carotid doppler if clinical situation and symptoms warrant in 6-12 months. Patient should be encouraged to change lifestyles such as smoking cessation, regular exercise and dietary modification. Use of statins in the right clinical setting and ASA is encouraged.  Allyne Gee, MD [facility] Pulmonary Critical Care Medicine

## 2021-08-29 ENCOUNTER — Telehealth: Payer: Self-pay

## 2021-08-29 ENCOUNTER — Ambulatory Visit: Payer: Medicare Other | Admitting: Physician Assistant

## 2021-08-29 NOTE — Telephone Encounter (Signed)
Spoke to pt yesterday, provided results and asked her if she wanted to keep appt for 08-29-21, she said she did not, that she would review results more in detail at her march appt. Canceled appt for 08-29-21.

## 2021-08-30 ENCOUNTER — Other Ambulatory Visit: Payer: Self-pay | Admitting: Physician Assistant

## 2021-08-30 DIAGNOSIS — I10 Essential (primary) hypertension: Secondary | ICD-10-CM

## 2021-09-24 ENCOUNTER — Encounter: Payer: Self-pay | Admitting: Internal Medicine

## 2021-10-24 ENCOUNTER — Other Ambulatory Visit: Payer: Self-pay

## 2021-10-24 ENCOUNTER — Telehealth: Payer: Self-pay

## 2021-10-24 ENCOUNTER — Encounter: Payer: Self-pay | Admitting: Physician Assistant

## 2021-10-24 ENCOUNTER — Ambulatory Visit (INDEPENDENT_AMBULATORY_CARE_PROVIDER_SITE_OTHER): Payer: Medicare Other | Admitting: Physician Assistant

## 2021-10-24 DIAGNOSIS — M25551 Pain in right hip: Secondary | ICD-10-CM | POA: Diagnosis not present

## 2021-10-24 DIAGNOSIS — E538 Deficiency of other specified B group vitamins: Secondary | ICD-10-CM

## 2021-10-24 DIAGNOSIS — M5441 Lumbago with sciatica, right side: Secondary | ICD-10-CM

## 2021-10-24 DIAGNOSIS — I10 Essential (primary) hypertension: Secondary | ICD-10-CM

## 2021-10-24 DIAGNOSIS — G8929 Other chronic pain: Secondary | ICD-10-CM

## 2021-10-24 DIAGNOSIS — R5383 Other fatigue: Secondary | ICD-10-CM

## 2021-10-24 DIAGNOSIS — M25562 Pain in left knee: Secondary | ICD-10-CM

## 2021-10-24 DIAGNOSIS — E559 Vitamin D deficiency, unspecified: Secondary | ICD-10-CM

## 2021-10-24 DIAGNOSIS — E782 Mixed hyperlipidemia: Secondary | ICD-10-CM

## 2021-10-24 DIAGNOSIS — G479 Sleep disorder, unspecified: Secondary | ICD-10-CM

## 2021-10-24 DIAGNOSIS — F419 Anxiety disorder, unspecified: Secondary | ICD-10-CM

## 2021-10-24 MED ORDER — AMLODIPINE BESYLATE 2.5 MG PO TABS: 2.5000 mg | ORAL_TABLET | Freq: Every day | ORAL | 1 refills | Status: DC

## 2021-10-24 MED ORDER — ESCITALOPRAM OXALATE 5 MG PO TABS: ORAL_TABLET | ORAL | 1 refills | Status: DC

## 2021-10-24 NOTE — Telephone Encounter (Signed)
Awaiting 10/24/21 office notes for orthopedic referral-Toni ?

## 2021-10-24 NOTE — Progress Notes (Signed)
[facility] ?[address] ?[place], [place] [address] ? ?Internal MEDICINE  ?Office Visit Note ? ?Patient Name: Cindy Duke ? [id number]  ?[id number] ? ?Date of Service: 10/29/2021 ? ?Chief Complaint  ?Patient presents with  ? Follow-up  ? Hypertension  ? Hip Pain  ?  Right hip still hurts  ? Knee Pain  ?  Left knee pain  ? ? ?HPI ?Patient is here for routine follow-up ?-Continuing to have left knee pain and right hip pain, has been using bengay which helps but still has pain.  ?-Is wondering if this is S/E of covid booster, as she and her sister have the same reaction with worsening joint pain ever since ?-Is interested in seeing Ortho at this time ?-BP not checked at home, needs to get new machine.  Will monitor closely as blood pressure is borderline in office today. May need to double norvasc if rising ?-Previously discussed carotid ultrasound results over the phone but reviewed again in office today, did not show significant plaque buildup and less than 50% stenosis seen bilaterally ?-Due for routine fasting labs prior to CPE ? ?Current Medication: ?Outpatient Encounter Medications as of 10/24/2021  ?Medication Sig  ? losartan (COZAAR) 50 MG tablet TAKE (1) TABLET BY MOUTH EVERY DAY  ? Multiple Vitamins-Minerals (MULTIVITAMIN WITH MINERALS) tablet Take 1 tablet by mouth daily.  ? Multiple Vitamins-Minerals (ZINC PO) Take by mouth daily.  ? [DISCONTINUED] amLODipine (NORVASC) 2.5 MG tablet Take 1 tablet (2.5 mg total) by mouth daily.  ? [DISCONTINUED] escitalopram (LEXAPRO) 5 MG tablet TAKE (1) TABLET BY MOUTH EVERY DAY  ? amLODipine (NORVASC) 2.5 MG tablet Take 1 tablet (2.5 mg total) by mouth daily.  ? escitalopram (LEXAPRO) 5 MG tablet TAKE (1) TABLET BY MOUTH EVERY DAY  ? ?No facility-administered encounter medications on file as of 10/24/2021.  ? ? ?Surgical History: ?Past Surgical History:  ?Procedure Laterality Date  ? BREAST BIOPSY Left   ? bx/clip-neg  ? COLONOSCOPY  2016  ? Dr  Vira Agar- cleared for 5 yrs  ? COLONOSCOPY WITH PROPOFOL N/A 03/20/2020  ? Procedure: COLONOSCOPY WITH PROPOFOL;  Surgeon: Virgel Manifold, MD;  Location: [place] [facility];  Service: Endoscopy;  Laterality: N/A;  priority 4  ? POLYPECTOMY N/A 03/20/2020  ? Procedure: POLYPECTOMY;  Surgeon: Virgel Manifold, MD;  Location: [facility];  Service: Endoscopy;  Laterality: N/A;  ? VAGINAL HYSTERECTOMY    ? VAGINAL PROLAPSE REPAIR    ? ? ?Medical History: ?Past Medical History:  ?Diagnosis Date  ? Hypertension   ? ? ?Family History: ?Family History  ?Problem Relation Age of Onset  ? Hypertension Mother   ? Colon cancer Mother   ? Pancreatic cancer Sister   ? Breast cancer Neg Hx   ? ? ?Social History  ? ?Socioeconomic History  ? Marital status: Married  ?  Spouse name: Not on file  ? Number of children: Not on file  ? Years of education: Not on file  ? Highest education level: Not on file  ?Occupational History  ? Not on file  ?Tobacco Use  ? Smoking status: Never  ? Smokeless tobacco: Never  ?Vaping Use  ? Vaping Use: Never used  ?Substance and Sexual Activity  ? Alcohol use: No  ?  Alcohol/week: 0.0 standard drinks  ?  Comment: rare wine  ? Drug use: No  ? Sexual activity: Not on file  ?Other Topics Concern  ? Not on file  ?Social History Narrative  ?  Not on file  ? ?Social Determinants of Health  ? ?Financial Resource Strain: Not on file  ?Food Insecurity: Not on file  ?Transportation Needs: Not on file  ?Physical Activity: Not on file  ?Stress: Not on file  ?Social Connections: Not on file  ?Intimate Partner Violence: Not on file  ? ? ? ? ?Review of Systems  ?Constitutional:  Negative for chills, fatigue and unexpected weight change.  ?HENT:  Negative for congestion, postnasal drip, rhinorrhea, sneezing and sore throat.   ?Eyes:  Negative for redness.  ?Respiratory:  Negative for cough, chest tightness and shortness of breath.   ?Cardiovascular:  Negative for chest pain and palpitations.   ?Gastrointestinal:  Negative for abdominal pain, constipation, diarrhea, nausea and vomiting.  ?Genitourinary:  Negative for dysuria and frequency.  ?Musculoskeletal:  Positive for arthralgias. Negative for back pain, joint swelling and neck pain.  ?     Right hip pain, left knee pain  ?Skin:  Negative for rash.  ?Neurological: Negative.  Negative for tremors and numbness.  ?Hematological:  Negative for adenopathy. Does not bruise/bleed easily.  ?Psychiatric/Behavioral:  Negative for behavioral problems (Depression), sleep disturbance and suicidal ideas. The patient is not nervous/anxious.   ? ?Vital Signs: ?BP (!) 142/70 Comment: 156/69  Pulse 76   Temp 98.4 ?F (36.9 ?C)   Resp 16   Ht '5\' 8"'$  (1.727 m)   Wt 155 lb 3.2 oz (70.4 kg)   SpO2 98%   BMI 23.60 kg/m?  ? ? ?Physical Exam ?Vitals and nursing note reviewed.  ?Constitutional:   ?   General: She is not in acute distress. ?   Appearance: She is well-developed and normal weight. She is not diaphoretic.  ?HENT:  ?   Head: Normocephalic and atraumatic.  ?   Mouth/Throat:  ?   Pharynx: No oropharyngeal exudate.  ?Eyes:  ?   Pupils: Pupils are equal, round, and reactive to light.  ?Neck:  ?   Thyroid: No thyromegaly.  ?   Vascular: No JVD.  ?   Trachea: No tracheal deviation.  ?Cardiovascular:  ?   Rate and Rhythm: Normal rate and regular rhythm.  ?   Heart sounds: Normal heart sounds. No murmur heard. ?  No friction rub. No gallop.  ?Pulmonary:  ?   Effort: Pulmonary effort is normal. No respiratory distress.  ?   Breath sounds: No wheezing or rales.  ?Chest:  ?   Chest wall: No tenderness.  ?Abdominal:  ?   General: Bowel sounds are normal.  ?   Palpations: Abdomen is soft.  ?Musculoskeletal:     ?   General: Normal range of motion.  ?   Cervical back: Normal range of motion and neck supple.  ?   Comments: Pain in right hip and left knee with movement and walking  ?Lymphadenopathy:  ?   Cervical: No cervical adenopathy.  ?Skin: ?   General: Skin is warm and  dry.  ?Neurological:  ?   Mental Status: She is alert and oriented to person, place, and time.  ?   Cranial Nerves: No cranial nerve deficit.  ?Psychiatric:     ?   Behavior: Behavior normal.     ?   Thought Content: Thought content normal.     ?   Judgment: Judgment normal.  ? ? ? ? ? ?Assessment/Plan: ?1. Essential hypertension ?Borderline elevated in office today, will continue current medications for now but may need to increase Norvasc in the future.  Patient will  monitor blood pressures closely ?- amLODipine (NORVASC) 2.5 MG tablet; Take 1 tablet (2.5 mg total) by mouth daily.  Dispense: 90 tablet; Refill: 1 ? ?2. Chronic hip pain, right ?Previous x-rays only showed degenerative changes but no acute abnormalities and patient had previously declined and referral in an effort to try exercises first however pain has continued and is interested in referral at this time.  Using Bengay as needed to help with some pain ?- AMB referral to orthopedics ? ?3. Chronic pain of left knee ?Will refer to Ortho for further management ?- AMB referral to orthopedics ? ?4. Acute right-sided low back pain with right-sided sciatica ?Previously could not tolerate gabapentin but continues to have right-sided low back and hip pain.  We will go ahead and refer to Ortho for further management ? ?5. Anxiety ?Stable, continue Lexapro ?- escitalopram (LEXAPRO) 5 MG tablet; TAKE (1) TABLET BY MOUTH EVERY DAY  Dispense: 90 tablet; Refill: 1 ? ?6. Sleep disturbance ?Stable, continue Lexapro ?- escitalopram (LEXAPRO) 5 MG tablet; TAKE (1) TABLET BY MOUTH EVERY DAY  Dispense: 90 tablet; Refill: 1 ? ?7. Vitamin D deficiency ?- VITAMIN D 25 Hydroxy (Vit-D Deficiency, Fractures) ? ?8. Mixed hyperlipidemia ?Continue to work on diet and exercise and will recheck labs ?- Lipid Panel With LDL/HDL Ratio ? ?9. B12 deficiency ?- B12 and Folate Panel ? ?10. Other fatigue ?- CBC w/Diff/Platelet ?- Comprehensive metabolic panel ?- TSH + free T4 ?- Lipid  Panel With LDL/HDL Ratio ?- B12 and Folate Panel ?- VITAMIN D 25 Hydroxy (Vit-D Deficiency, Fractures) ? ? ?General Counseling: layah skousen understanding of the findings of todays visit and agrees with plan of treatmen

## 2021-10-30 NOTE — Telephone Encounter (Signed)
Referral sent via proficient to [facility] ?

## 2021-11-04 ENCOUNTER — Telehealth: Payer: Self-pay

## 2021-11-04 NOTE — Telephone Encounter (Signed)
Per Danton Sewer w/ [facility], referral closed due to patient not returning call to schedule appointment-Toni ?

## 2021-12-30 DIAGNOSIS — S93421A Sprain of deltoid ligament of right ankle, initial encounter: Secondary | ICD-10-CM | POA: Diagnosis not present

## 2021-12-30 DIAGNOSIS — M25571 Pain in right ankle and joints of right foot: Secondary | ICD-10-CM | POA: Diagnosis not present

## 2021-12-30 DIAGNOSIS — M25551 Pain in right hip: Secondary | ICD-10-CM | POA: Diagnosis not present

## 2022-01-27 DIAGNOSIS — M65871 Other synovitis and tenosynovitis, right ankle and foot: Secondary | ICD-10-CM | POA: Diagnosis not present

## 2022-01-27 DIAGNOSIS — M25571 Pain in right ankle and joints of right foot: Secondary | ICD-10-CM | POA: Diagnosis not present

## 2022-01-27 DIAGNOSIS — M76821 Posterior tibial tendinitis, right leg: Secondary | ICD-10-CM | POA: Diagnosis not present

## 2022-01-27 DIAGNOSIS — G8929 Other chronic pain: Secondary | ICD-10-CM | POA: Diagnosis not present

## 2022-02-24 ENCOUNTER — Ambulatory Visit: Payer: Medicare Other | Admitting: Physician Assistant

## 2022-02-24 DIAGNOSIS — M25571 Pain in right ankle and joints of right foot: Secondary | ICD-10-CM | POA: Diagnosis not present

## 2022-02-24 DIAGNOSIS — M76821 Posterior tibial tendinitis, right leg: Secondary | ICD-10-CM | POA: Diagnosis not present

## 2022-02-24 DIAGNOSIS — G8929 Other chronic pain: Secondary | ICD-10-CM | POA: Diagnosis not present

## 2022-02-24 DIAGNOSIS — M65871 Other synovitis and tenosynovitis, right ankle and foot: Secondary | ICD-10-CM | POA: Diagnosis not present

## 2022-02-24 DIAGNOSIS — I10 Essential (primary) hypertension: Secondary | ICD-10-CM | POA: Diagnosis not present

## 2022-02-25 DIAGNOSIS — R5383 Other fatigue: Secondary | ICD-10-CM | POA: Diagnosis not present

## 2022-02-25 DIAGNOSIS — E559 Vitamin D deficiency, unspecified: Secondary | ICD-10-CM | POA: Diagnosis not present

## 2022-02-25 DIAGNOSIS — E538 Deficiency of other specified B group vitamins: Secondary | ICD-10-CM | POA: Diagnosis not present

## 2022-02-25 DIAGNOSIS — E782 Mixed hyperlipidemia: Secondary | ICD-10-CM | POA: Diagnosis not present

## 2022-02-26 LAB — CBC WITH DIFFERENTIAL/PLATELET
Basophils Absolute: 0.1 10*3/uL (ref 0.0–0.2)
Basos: 1 %
EOS (ABSOLUTE): 0.2 10*3/uL (ref 0.0–0.4)
Eos: 3 %
Hematocrit: 40.1 % (ref 34.0–46.6)
Hemoglobin: 13.9 g/dL (ref 11.1–15.9)
Immature Grans (Abs): 0 10*3/uL (ref 0.0–0.1)
Immature Granulocytes: 0 %
Lymphocytes Absolute: 1 10*3/uL (ref 0.7–3.1)
Lymphs: 21 %
MCH: 31.5 pg (ref 26.6–33.0)
MCHC: 34.7 g/dL (ref 31.5–35.7)
MCV: 91 fL (ref 79–97)
Monocytes Absolute: 0.3 10*3/uL (ref 0.1–0.9)
Monocytes: 7 %
Neutrophils Absolute: 3.2 10*3/uL (ref 1.4–7.0)
Neutrophils: 68 %
Platelets: 228 10*3/uL (ref 150–450)
RBC: 4.41 x10E6/uL (ref 3.77–5.28)
RDW: 12 % (ref 11.7–15.4)
WBC: 4.7 10*3/uL (ref 3.4–10.8)

## 2022-02-26 LAB — LIPID PANEL WITH LDL/HDL RATIO
Cholesterol, Total: 187 mg/dL (ref 100–199)
HDL: 55 mg/dL (ref >39)
LDL Chol Calc (NIH): 115 mg/dL — ABNORMAL HIGH (ref 0–99)
LDL/HDL Ratio: 2.1 ratio (ref 0.0–3.2)
Triglycerides: 96 mg/dL (ref 0–149)
VLDL Cholesterol Cal: 17 mg/dL (ref 5–40)

## 2022-02-26 LAB — COMPREHENSIVE METABOLIC PANEL
ALT: 14 IU/L (ref 0–32)
AST: 12 IU/L (ref 0–40)
Albumin/Globulin Ratio: 2.2 (ref 1.2–2.2)
Albumin: 4.4 g/dL (ref 3.8–4.8)
Alkaline Phosphatase: 105 IU/L (ref 44–121)
BUN/Creatinine Ratio: 27 (ref 12–28)
BUN: 18 mg/dL (ref 8–27)
Bilirubin Total: 0.3 mg/dL (ref 0.0–1.2)
CO2: 21 mmol/L (ref 20–29)
Calcium: 10.5 mg/dL — ABNORMAL HIGH (ref 8.7–10.3)
Chloride: 107 mmol/L — ABNORMAL HIGH (ref 96–106)
Creatinine, Ser: 0.66 mg/dL (ref 0.57–1.00)
Globulin, Total: 2 g/dL (ref 1.5–4.5)
Glucose: 99 mg/dL (ref 70–99)
Potassium: 4.6 mmol/L (ref 3.5–5.2)
Sodium: 142 mmol/L (ref 134–144)
Total Protein: 6.4 g/dL (ref 6.0–8.5)
eGFR: 90 mL/min/{1.73_m2} (ref >59)

## 2022-02-26 LAB — VITAMIN D 25 HYDROXY (VIT D DEFICIENCY, FRACTURES): Vit D, 25-Hydroxy: 36.9 ng/mL (ref 30.0–100.0)

## 2022-02-26 LAB — B12 AND FOLATE PANEL
Folate: 19.9 ng/mL (ref >3.0)
Vitamin B-12: 748 pg/mL (ref 232–1245)

## 2022-02-26 LAB — TSH+FREE T4
Free T4: 0.97 ng/dL (ref 0.82–1.77)
TSH: 1.39 u[IU]/mL (ref 0.450–4.500)

## 2022-03-03 ENCOUNTER — Telehealth: Payer: Self-pay

## 2022-03-03 ENCOUNTER — Ambulatory Visit (INDEPENDENT_AMBULATORY_CARE_PROVIDER_SITE_OTHER): Payer: Medicare Other | Admitting: Physician Assistant

## 2022-03-03 ENCOUNTER — Encounter: Payer: Self-pay | Admitting: Physician Assistant

## 2022-03-03 VITALS — BP 152/62 | HR 78 | Temp 98.0°F | Resp 16 | Ht 68.0 in | Wt 152.0 lb

## 2022-03-03 DIAGNOSIS — Z0001 Encounter for general adult medical examination with abnormal findings: Secondary | ICD-10-CM

## 2022-03-03 DIAGNOSIS — I10 Essential (primary) hypertension: Secondary | ICD-10-CM

## 2022-03-03 DIAGNOSIS — M25551 Pain in right hip: Secondary | ICD-10-CM

## 2022-03-03 DIAGNOSIS — E782 Mixed hyperlipidemia: Secondary | ICD-10-CM | POA: Diagnosis not present

## 2022-03-03 DIAGNOSIS — R300 Dysuria: Secondary | ICD-10-CM | POA: Diagnosis not present

## 2022-03-03 DIAGNOSIS — G8929 Other chronic pain: Secondary | ICD-10-CM

## 2022-03-03 NOTE — Telephone Encounter (Addendum)
Called [facility] spoke to [place] and added   ionized calcium and intact PTH.  They couldn't do the intact PTH due to the lavender tube has to be refrigerated and it has been at room temp.  Later we got a fax from [facility] and they advised that there were not any samples available to run the requested labs.  I informed Lauren that she will need to have pt go to [facility] for both test

## 2022-03-03 NOTE — Progress Notes (Signed)
[facility] [place], [place] [address]  Internal MEDICINE  Office Visit Note  Patient Name: Cindy Duke  [id number]  [id number]  Date of Service: 03/09/2022  Chief Complaint  Patient presents with   Medicare Wellness   Hypertension     HPI Pt is here for routine health maintenance examination -Labs reviewed--calcium elevated, LDL elevated but improving. Only taking multivitamin no separate calcium supplement, does drink milk. Will need to order follow up labs -Right ankle has been bothering her for weeks and is seeing podiatry now. States she went to ortho about her hip, but focus moved to her ankle and hip hasnt been addressed. May follow back up with ortho for hip after ankle is better with podiatry -BP elevated in office -UTD on PHM  Current Medication: Outpatient Encounter Medications as of 03/03/2022  Medication Sig   amLODipine (NORVASC) 2.5 MG tablet Take 1 tablet (2.5 mg total) by mouth daily.   escitalopram (LEXAPRO) 5 MG tablet TAKE (1) TABLET BY MOUTH EVERY DAY   losartan (COZAAR) 50 MG tablet TAKE (1) TABLET BY MOUTH EVERY DAY   meloxicam (MOBIC) 15 MG tablet Take 15 mg by mouth daily.   Multiple Vitamins-Minerals (MULTIVITAMIN WITH MINERALS) tablet Take 1 tablet by mouth daily.   Multiple Vitamins-Minerals (ZINC PO) Take by mouth daily.   No facility-administered encounter medications on file as of 03/03/2022.    Surgical History: Past Surgical History:  Procedure Laterality Date   BREAST BIOPSY Left    bx/clip-neg   COLONOSCOPY  2016   Dr Vira Agar- cleared for 5 yrs   COLONOSCOPY WITH PROPOFOL N/A 03/20/2020   Procedure: COLONOSCOPY WITH PROPOFOL;  Surgeon: Virgel Manifold, MD;  Location: [facility];  Service: Endoscopy;  Laterality: N/A;  priority 4   POLYPECTOMY N/A 03/20/2020   Procedure: POLYPECTOMY;  Surgeon: Virgel Manifold, MD;  Location: [facility] [facility];  Service: Endoscopy;  Laterality:  N/A;   VAGINAL HYSTERECTOMY     VAGINAL PROLAPSE REPAIR      Medical History: Past Medical History:  Diagnosis Date   Hypertension     Family History: Family History  Problem Relation Age of Onset   Hypertension Mother    Colon cancer Mother    Pancreatic cancer Sister    Breast cancer Neg Hx       Review of Systems  Constitutional:  Negative for chills, fatigue and unexpected weight change.  HENT:  Negative for congestion, postnasal drip, rhinorrhea, sneezing and sore throat.   Eyes:  Negative for redness.  Respiratory:  Negative for cough, chest tightness and shortness of breath.   Cardiovascular:  Negative for chest pain and palpitations.  Gastrointestinal:  Negative for abdominal pain, constipation, diarrhea, nausea and vomiting.  Genitourinary:  Negative for dysuria and frequency.  Musculoskeletal:  Positive for arthralgias. Negative for back pain, joint swelling and neck pain.       Right hip pain and right ankle pain  Skin:  Negative for rash.  Neurological: Negative.  Negative for tremors and numbness.  Hematological:  Negative for adenopathy. Does not bruise/bleed easily.  Psychiatric/Behavioral:  Negative for behavioral problems (Depression), sleep disturbance and suicidal ideas. The patient is not nervous/anxious.      Vital Signs: BP (!) 152/62 Comment: 163/84  Pulse 78   Temp 98 F (36.7 C)   Resp 16   Ht $R'5\' 8"'XT$  (1.727 m)   Wt 152 lb (68.9 kg)   SpO2 98%   BMI 23.11 kg/m  Physical Exam Vitals and nursing note reviewed.  Constitutional:      General: She is not in acute distress.    Appearance: Normal appearance. She is well-developed and normal weight. She is not diaphoretic.  HENT:     Head: Normocephalic and atraumatic.     Mouth/Throat:     Pharynx: No oropharyngeal exudate.  Eyes:     Pupils: Pupils are equal, round, and reactive to light.  Neck:     Thyroid: No thyromegaly.     Vascular: No JVD.     Trachea: No tracheal deviation.   Cardiovascular:     Rate and Rhythm: Normal rate and regular rhythm.     Heart sounds: Normal heart sounds. No murmur heard.    No friction rub. No gallop.  Pulmonary:     Effort: Pulmonary effort is normal. No respiratory distress.     Breath sounds: No wheezing or rales.  Chest:     Chest wall: No tenderness.  Abdominal:     General: Bowel sounds are normal.     Palpations: Abdomen is soft.     Tenderness: There is no abdominal tenderness.  Musculoskeletal:        General: Normal range of motion.     Cervical back: Normal range of motion and neck supple.  Lymphadenopathy:     Cervical: No cervical adenopathy.  Skin:    General: Skin is warm and dry.  Neurological:     Mental Status: She is alert and oriented to person, place, and time.     Cranial Nerves: No cranial nerve deficit.  Psychiatric:        Behavior: Behavior normal.        Thought Content: Thought content normal.        Judgment: Judgment normal.      LABS: Recent Results (from the past 2160 hour(s))  CBC w/Diff/Platelet     Status: None   Collection Time: 02/25/22  8:20 AM  Result Value Ref Range   WBC 4.7 3.4 - 10.8 x10E3/uL   RBC 4.41 3.77 - 5.28 x10E6/uL   Hemoglobin 13.9 11.1 - 15.9 g/dL   Hematocrit 40.1 34.0 - 46.6 %   MCV 91 79 - 97 fL   MCH 31.5 26.6 - 33.0 pg   MCHC 34.7 31.5 - 35.7 g/dL   RDW 12.0 11.7 - 15.4 %   Platelets 228 150 - 450 x10E3/uL   Neutrophils 68 Not Estab. %   Lymphs 21 Not Estab. %   Monocytes 7 Not Estab. %   Eos 3 Not Estab. %   Basos 1 Not Estab. %   Neutrophils Absolute 3.2 1.4 - 7.0 x10E3/uL   Lymphocytes Absolute 1.0 0.7 - 3.1 x10E3/uL   Monocytes Absolute 0.3 0.1 - 0.9 x10E3/uL   EOS (ABSOLUTE) 0.2 0.0 - 0.4 x10E3/uL   Basophils Absolute 0.1 0.0 - 0.2 x10E3/uL   Immature Granulocytes 0 Not Estab. %   Immature Grans (Abs) 0.0 0.0 - 0.1 x10E3/uL  Comprehensive metabolic panel     Status: Abnormal   Collection Time: 02/25/22  8:20 AM  Result Value Ref Range    Glucose 99 70 - 99 mg/dL   BUN 18 8 - 27 mg/dL   Creatinine, Ser 0.66 0.57 - 1.00 mg/dL   eGFR 90 >59 mL/min/1.73   BUN/Creatinine Ratio 27 12 - 28   Sodium 142 134 - 144 mmol/L   Potassium 4.6 3.5 - 5.2 mmol/L   Chloride 107 (H) 96 - 106 mmol/L  CO2 21 20 - 29 mmol/L   Calcium 10.5 (H) 8.7 - 10.3 mg/dL   Total Protein 6.4 6.0 - 8.5 g/dL   Albumin 4.4 3.8 - 4.8 g/dL    Comment:               **Please note reference interval change**   Globulin, Total 2.0 1.5 - 4.5 g/dL   Albumin/Globulin Ratio 2.2 1.2 - 2.2   Bilirubin Total 0.3 0.0 - 1.2 mg/dL   Alkaline Phosphatase 105 44 - 121 IU/L   AST 12 0 - 40 IU/L   ALT 14 0 - 32 IU/L  TSH + free T4     Status: None   Collection Time: 02/25/22  8:20 AM  Result Value Ref Range   TSH 1.390 0.450 - 4.500 uIU/mL   Free T4 0.97 0.82 - 1.77 ng/dL  Lipid Panel With LDL/HDL Ratio     Status: Abnormal   Collection Time: 02/25/22  8:20 AM  Result Value Ref Range   Cholesterol, Total 187 100 - 199 mg/dL   Triglycerides 96 0 - 149 mg/dL   HDL 55 >39 mg/dL   VLDL Cholesterol Cal 17 5 - 40 mg/dL   LDL Chol Calc (NIH) 115 (H) 0 - 99 mg/dL   LDL/HDL Ratio 2.1 0.0 - 3.2 ratio    Comment:                                     LDL/HDL Ratio                                             Men  Women                               1/2 Avg.Risk  1.0    1.5                                   Avg.Risk  3.6    3.2                                2X Avg.Risk  6.2    5.0                                3X Avg.Risk  8.0    6.1   B12 and Folate Panel     Status: None   Collection Time: 02/25/22  8:20 AM  Result Value Ref Range   Vitamin B-12 748 232 - 1,245 pg/mL   Folate 19.9 >3.0 ng/mL    Comment: A serum folate concentration of less than 3.1 ng/mL is considered to represent clinical deficiency.   VITAMIN D 25 Hydroxy (Vit-D Deficiency, Fractures)     Status: None   Collection Time: 02/25/22  8:20 AM  Result Value Ref Range   Vit D, 25-Hydroxy 36.9 30.0 -  100.0 ng/mL    Comment: Vitamin D deficiency has been defined by the [place] practice guideline as a level of serum 25-OH vitamin D less  than 20 ng/mL (1,2). The Endocrine Society went on to further define vitamin D insufficiency as a level between 21 and 29 ng/mL (2). 1. IOM (Institute of Medicine). 2010. Dietary reference    intakes for calcium and D. [place]: The    [employer]. 2. Holick MF, Binkley Rozel, Bischoff-Ferrari HA, et al.    Evaluation, treatment, and prevention of vitamin D    deficiency: an Endocrine Society clinical practice    guideline. JCEM. 2011 Jul; 96(7):1911-30.   UA/M w/rflx Culture, Routine     Status: Abnormal   Collection Time: 03/03/22 11:27 AM   Specimen: Urine   Urine  Result Value Ref Range   Specific Gravity, UA 1.023 1.005 - 1.030   pH, UA 5.0 5.0 - 7.5   Color, UA Yellow Yellow   Appearance Ur Clear Clear   Leukocytes,UA 1+ (A) Negative   Protein,UA Negative Negative/Trace   Glucose, UA Negative Negative   Ketones, UA Negative Negative   RBC, UA Negative Negative   Bilirubin, UA Negative Negative   Urobilinogen, Ur 1.0 0.2 - 1.0 mg/dL   Nitrite, UA Negative Negative   Microscopic Examination See below:     Comment: Microscopic was indicated and was performed.   Urinalysis Reflex Comment     Comment: This specimen has reflexed to a Urine Culture.  Microscopic Examination     Status: Abnormal   Collection Time: 03/03/22 11:27 AM   Urine  Result Value Ref Range   WBC, UA 6-10 (A) 0 - 5 /hpf   RBC, Urine 0-2 0 - 2 /hpf   Epithelial Cells (non renal) 0-10 0 - 10 /hpf   Casts None seen None seen /lpf   Bacteria, UA None seen None seen/Few  Urine Culture, Reflex     Status: None   Collection Time: 03/03/22 11:27 AM   Urine  Result Value Ref Range   Urine Culture, Routine Final report    Organism ID, Bacteria Comment     Comment: Mixed urogenital flora Less than 10,000 colonies/mL          Assessment/Plan: 1. Encounter for general adult medical examination with abnormal findings CPE performed, labs reviewed, UTD on PHM  2. Essential hypertension Elevated in office, possibly due to pain, will monitor  3. Chronic hip pain, right Will follow up with ortho  4. Hypercalcemia Will reduce any supplementation and will check additional labs - Calcium, ionized - PTH, Intact and Calcium  5. Mixed hyperlipidemia Improving, continue to work on diet and exercise  6. Dysuria - UA/M w/rflx Culture, Routine   General Counseling: jazzmin newbold understanding of the findings of todays visit and agrees with plan of treatment. I have discussed any further diagnostic evaluation that may be needed or ordered today. We also reviewed her medications today. she has been encouraged to call the office with any questions or concerns that should arise related to todays visit.    Counseling:    Orders Placed This Encounter  Procedures   Microscopic Examination   Urine Culture, Reflex   UA/M w/rflx Culture, Routine   Calcium, ionized   PTH, Intact and Calcium    No orders of the defined types were placed in this encounter.   This patient was seen by Lynn Ito, PA-C in collaboration with Dr. Beverely Risen as a part of collaborative care agreement.  Total time spent:35 Minutes  Time spent includes review of chart, medications, test results, and follow up plan with the patient.  Lavera Guise, MD  Internal Medicine

## 2022-03-03 NOTE — Telephone Encounter (Signed)
error 

## 2022-03-06 LAB — URINE CULTURE, REFLEX

## 2022-03-06 LAB — MICROSCOPIC EXAMINATION
Bacteria, UA: NONE SEEN
Casts: NONE SEEN /lpf

## 2022-03-06 LAB — UA/M W/RFLX CULTURE, ROUTINE
Bilirubin, UA: NEGATIVE
Glucose, UA: NEGATIVE
Ketones, UA: NEGATIVE
Nitrite, UA: NEGATIVE
Protein,UA: NEGATIVE
RBC, UA: NEGATIVE
Specific Gravity, UA: 1.023 (ref 1.005–1.030)
Urobilinogen, Ur: 1 mg/dL (ref 0.2–1.0)
pH, UA: 5 (ref 5.0–7.5)

## 2022-03-10 ENCOUNTER — Telehealth: Payer: Self-pay

## 2022-03-10 NOTE — Telephone Encounter (Signed)
-----   Message from Mylinda Latina, PA-C sent at 03/09/2022 11:35 AM EDT ----- Please let her know that unfortunately the lab could not use previous sample for the additional labs to be added and she will need to go to [facility] for a new draw to do these labs to follow up on her high calcium results.

## 2022-03-10 NOTE — Telephone Encounter (Signed)
LMOM with both home and cell number that we have placed orders for pt to have labs drawn due to the previous sample could not be used.  Advised to call us back if any questions

## 2022-03-17 ENCOUNTER — Other Ambulatory Visit: Payer: Self-pay | Admitting: Internal Medicine

## 2022-03-28 LAB — PHOSPHORUS: Phosphorus: 3.3 mg/dL (ref 3.0–4.3)

## 2022-03-28 LAB — PTH, INTACT AND CALCIUM
Calcium: 10.3 mg/dL (ref 8.7–10.3)
PTH: 80 pg/mL — ABNORMAL HIGH (ref 15–65)

## 2022-03-28 LAB — CALCIUM, IONIZED: Calcium, Ion: 5.8 mg/dL — ABNORMAL HIGH (ref 4.5–5.6)

## 2022-04-01 ENCOUNTER — Other Ambulatory Visit: Payer: Self-pay | Admitting: Internal Medicine

## 2022-04-01 NOTE — Progress Notes (Signed)
PTH scan, then ENT

## 2022-04-03 ENCOUNTER — Ambulatory Visit: Payer: Medicare Other | Admitting: Physician Assistant

## 2022-04-04 DIAGNOSIS — H524 Presbyopia: Secondary | ICD-10-CM | POA: Diagnosis not present

## 2022-04-04 DIAGNOSIS — H2513 Age-related nuclear cataract, bilateral: Secondary | ICD-10-CM | POA: Diagnosis not present

## 2022-04-07 ENCOUNTER — Ambulatory Visit (INDEPENDENT_AMBULATORY_CARE_PROVIDER_SITE_OTHER): Payer: Medicare Other | Admitting: Physician Assistant

## 2022-04-07 ENCOUNTER — Encounter: Payer: Self-pay | Admitting: Physician Assistant

## 2022-04-07 VITALS — BP 127/66 | HR 79 | Temp 97.8°F | Resp 16 | Ht 68.0 in | Wt 157.0 lb

## 2022-04-07 DIAGNOSIS — M76821 Posterior tibial tendinitis, right leg: Secondary | ICD-10-CM | POA: Diagnosis not present

## 2022-04-07 DIAGNOSIS — E210 Primary hyperparathyroidism: Secondary | ICD-10-CM | POA: Diagnosis not present

## 2022-04-07 DIAGNOSIS — I10 Essential (primary) hypertension: Secondary | ICD-10-CM

## 2022-04-07 NOTE — Progress Notes (Signed)
[facility] [place], [place] [address]  Internal MEDICINE  Office Visit Note  Patient Name: Cindy Duke  [id number]  [id number]  Date of Service: 04/16/2022  Chief Complaint  Patient presents with   Follow-up   Hypertension    HPI Pt is here for routine follow up with HTN and labs -Bp has been 025E systolic but better today -She has thyroid nodules and is followed by ENT, Dr. Ladene Artist. Hasnt been seen by him in over a year though. Has had biopsies of thyroid in past as well. Used to see Dr. Ronnald Collum with endo but labs looked ok and ended up just needing ENT to follow for [place] and in case surgery ever needed. -Discussed the additional labs done due to elevated calcium show elevated parathyroid hormone and is consistent with primary hyperparathyroidism. Discussed ordering a parathyroid scan to evaluate further and then will plan to follow up with her ENT after to discuss next steps. She does have some bone pain that may be due to arthritis or she attributed it to the covid vaccine she got awhile back, however we did discuss this is also a symptom of hyperparathyroidism  Current Medication: Outpatient Encounter Medications as of 04/07/2022  Medication Sig   amLODipine (NORVASC) 2.5 MG tablet Take 1 tablet (2.5 mg total) by mouth daily.   escitalopram (LEXAPRO) 5 MG tablet TAKE (1) TABLET BY MOUTH EVERY DAY   losartan (COZAAR) 50 MG tablet TAKE (1) TABLET BY MOUTH EVERY DAY   meloxicam (MOBIC) 15 MG tablet Take 15 mg by mouth daily.   Multiple Vitamins-Minerals (MULTIVITAMIN WITH MINERALS) tablet Take 1 tablet by mouth daily.   Multiple Vitamins-Minerals (ZINC PO) Take by mouth daily.   No facility-administered encounter medications on file as of 04/07/2022.    Surgical History: Past Surgical History:  Procedure Laterality Date   BREAST BIOPSY Left    bx/clip-neg   COLONOSCOPY  2016   Dr Vira Agar- cleared for 5 yrs   COLONOSCOPY WITH PROPOFOL N/A  03/20/2020   Procedure: COLONOSCOPY WITH PROPOFOL;  Surgeon: Virgel Manifold, MD;  Location: [facility];  Service: Endoscopy;  Laterality: N/A;  priority 4   POLYPECTOMY N/A 03/20/2020   Procedure: POLYPECTOMY;  Surgeon: Virgel Manifold, MD;  Location: [facility];  Service: Endoscopy;  Laterality: N/A;   VAGINAL HYSTERECTOMY     VAGINAL PROLAPSE REPAIR      Medical History: Past Medical History:  Diagnosis Date   Hypertension     Family History: Family History  Problem Relation Age of Onset   Hypertension Mother    Colon cancer Mother    Pancreatic cancer Sister    Breast cancer Neg Hx     Social History   Socioeconomic History   Marital status: Married    Spouse name: Not on file   Number of children: Not on file   Years of education: Not on file   Highest education level: Not on file  Occupational History   Not on file  Tobacco Use   Smoking status: Never   Smokeless tobacco: Never  Vaping Use   Vaping Use: Never used  Substance and Sexual Activity   Alcohol use: No    Alcohol/week: 0.0 standard drinks of alcohol    Comment: rare wine   Drug use: No   Sexual activity: Not on file  Other Topics Concern   Not on file  Social History Narrative   Not on file   Social Determinants  of Health   Financial Resource Strain: Not on file  Food Insecurity: Not on file  Transportation Needs: Not on file  Physical Activity: Not on file  Stress: Not on file  Social Connections: Not on file  Intimate Partner Violence: Not on file      Review of Systems  Constitutional:  Negative for chills, fatigue and unexpected weight change.  HENT:  Negative for congestion, postnasal drip, rhinorrhea, sneezing and sore throat.   Eyes:  Negative for redness.  Respiratory:  Negative for cough, chest tightness and shortness of breath.   Cardiovascular:  Negative for chest pain and palpitations.  Gastrointestinal:  Negative for abdominal pain,  constipation, diarrhea, nausea and vomiting.  Genitourinary:  Negative for dysuria and frequency.  Musculoskeletal:  Positive for arthralgias. Negative for back pain, joint swelling and neck pain.       Right hip pain and right ankle pain  Skin:  Negative for rash.  Neurological: Negative.  Negative for tremors and numbness.  Hematological:  Negative for adenopathy. Does not bruise/bleed easily.  Psychiatric/Behavioral:  Negative for behavioral problems (Depression), sleep disturbance and suicidal ideas. The patient is not nervous/anxious.     Vital Signs: BP 127/66   Pulse 79   Temp 97.8 F (36.6 C)   Resp 16   Ht '5\' 8"'$  (1.727 m)   Wt 157 lb (71.2 kg)   SpO2 96%   BMI 23.87 kg/m    Physical Exam Vitals and nursing note reviewed.  Constitutional:      General: She is not in acute distress.    Appearance: Normal appearance. She is well-developed and normal weight. She is not diaphoretic.  HENT:     Head: Normocephalic and atraumatic.     Mouth/Throat:     Pharynx: No oropharyngeal exudate.  Eyes:     Pupils: Pupils are equal, round, and reactive to light.  Neck:     Thyroid: No thyromegaly.     Vascular: No JVD.     Trachea: No tracheal deviation.  Cardiovascular:     Rate and Rhythm: Normal rate and regular rhythm.     Heart sounds: Normal heart sounds. No murmur heard.    No friction rub. No gallop.  Pulmonary:     Effort: Pulmonary effort is normal. No respiratory distress.     Breath sounds: No wheezing or rales.  Chest:     Chest wall: No tenderness.  Abdominal:     General: Bowel sounds are normal.     Palpations: Abdomen is soft.     Tenderness: There is no abdominal tenderness.  Musculoskeletal:        General: Normal range of motion.     Cervical back: Normal range of motion and neck supple.  Lymphadenopathy:     Cervical: No cervical adenopathy.  Skin:    General: Skin is warm and dry.  Neurological:     Mental Status: She is alert and oriented to  person, place, and time.     Cranial Nerves: No cranial nerve deficit.  Psychiatric:        Behavior: Behavior normal.        Thought Content: Thought content normal.        Judgment: Judgment normal.        Assessment/Plan: 1. Primary hyperparathyroidism ([place]) Will order parathyroid scan and have patient follow up with ENT pending results - NM Parathyroid W/Spect; Future  2. Essential hypertension Well controlled in office, will continue current medications and monitoring  General Counseling: aleda madl understanding of the findings of todays visit and agrees with plan of treatment. I have discussed any further diagnostic evaluation that may be needed or ordered today. We also reviewed her medications today. she has been encouraged to call the office with any questions or concerns that should arise related to todays visit.    Orders Placed This Encounter  Procedures   NM Parathyroid W/Spect    No orders of the defined types were placed in this encounter.   This patient was seen by Drema Dallas, PA-C in collaboration with Dr. Clayborn Bigness as a part of collaborative care agreement.   Total time spent:30 Minutes Time spent includes review of chart, medications, test results, and follow up plan with the patient.      Dr Lavera Guise Internal medicine

## 2022-04-21 ENCOUNTER — Encounter: Admission: RE | Admit: 2022-04-21 | Payer: Medicare Other | Source: Ambulatory Visit

## 2022-04-21 ENCOUNTER — Other Ambulatory Visit: Payer: Medicare Other

## 2022-04-22 ENCOUNTER — Telehealth: Payer: Self-pay | Admitting: Physician Assistant

## 2022-04-22 ENCOUNTER — Other Ambulatory Visit: Payer: Self-pay | Admitting: Physician Assistant

## 2022-04-22 DIAGNOSIS — I10 Essential (primary) hypertension: Secondary | ICD-10-CM

## 2022-04-22 NOTE — Telephone Encounter (Signed)
Patient lvm to r/s 04/25/22 since she was not to have test done due to machine broke. Left message with her husband for her to return my call-Toni

## 2022-04-24 ENCOUNTER — Encounter
Admission: RE | Admit: 2022-04-24 | Discharge: 2022-04-24 | Disposition: A | Discharge status: Home / Self Care | Payer: Medicare Other | Source: Ambulatory Visit | Attending: Physician Assistant | Admitting: Physician Assistant

## 2022-04-24 DIAGNOSIS — E210 Primary hyperparathyroidism: Secondary | ICD-10-CM | POA: Insufficient documentation

## 2022-04-24 DIAGNOSIS — E213 Hyperparathyroidism, unspecified: Secondary | ICD-10-CM | POA: Diagnosis not present

## 2022-04-24 DIAGNOSIS — E010 Iodine-deficiency related diffuse (endemic) goiter: Secondary | ICD-10-CM | POA: Diagnosis not present

## 2022-04-24 MED ORDER — TECHNETIUM TC 99M SESTAMIBI GENERIC - CARDIOLITE
25.0000 | Freq: Once | INTRAVENOUS | Status: AC | PRN
  Administered 2022-04-24: 24.6 via INTRAVENOUS

## 2022-04-25 ENCOUNTER — Ambulatory Visit: Payer: Medicare Other | Admitting: Physician Assistant

## 2022-05-08 ENCOUNTER — Ambulatory Visit: Payer: Medicare Other | Admitting: Physician Assistant

## 2022-05-08 ENCOUNTER — Encounter: Payer: Self-pay | Admitting: Physician Assistant

## 2022-05-08 ENCOUNTER — Telehealth: Payer: Self-pay | Admitting: Physician Assistant

## 2022-05-08 DIAGNOSIS — I10 Essential (primary) hypertension: Secondary | ICD-10-CM

## 2022-05-08 DIAGNOSIS — E210 Primary hyperparathyroidism: Secondary | ICD-10-CM

## 2022-05-08 DIAGNOSIS — E042 Nontoxic multinodular goiter: Secondary | ICD-10-CM | POA: Diagnosis not present

## 2022-05-08 NOTE — Progress Notes (Signed)
[facility] [place], [place] [address]  Internal MEDICINE  Office Visit Note  Patient Name: Cindy Duke  [id number]  [id number]  Date of Service: 05/20/2022  Chief Complaint  Patient presents with   Follow-up   Hypertension    HPI Pt is here for routine follow up to review imaging -Reviewed NM parathryoid W/spect due to primary hyperparathyroidism on labs. IMPRESSION: 1. No scintigraphic evidence of parathyroid adenoma. 2. Persistent thyromegaly and heterogeneous radiotracer uptake consistent with known multinodular thyroid.  -Discussed going back to endocrinology for further evaluation and monitoring. Previously seen by Dr. Ronnald Collum and interested in referral back to him.  -BP is very elevated in office again. Will take 2 of her amlodipine ('5mg'$  total) and continue losartan and monitor at home.  Current Medication: Outpatient Encounter Medications as of 05/08/2022  Medication Sig   amLODipine (NORVASC) 2.5 MG tablet TAKE (1) TABLET BY MOUTH EVERY DAY   escitalopram (LEXAPRO) 5 MG tablet TAKE (1) TABLET BY MOUTH EVERY DAY   losartan (COZAAR) 50 MG tablet TAKE (1) TABLET BY MOUTH EVERY DAY   meloxicam (MOBIC) 15 MG tablet Take 15 mg by mouth daily.   Multiple Vitamins-Minerals (MULTIVITAMIN WITH MINERALS) tablet Take 1 tablet by mouth daily.   Multiple Vitamins-Minerals (ZINC PO) Take by mouth daily.   No facility-administered encounter medications on file as of 05/08/2022.    Surgical History: Past Surgical History:  Procedure Laterality Date   BREAST BIOPSY Left    bx/clip-neg   COLONOSCOPY  2016   Dr Vira Agar- cleared for 5 yrs   COLONOSCOPY WITH PROPOFOL N/A 03/20/2020   Procedure: COLONOSCOPY WITH PROPOFOL;  Surgeon: Virgel Manifold, MD;  Location: [place];  Service: Endoscopy;  Laterality: N/A;  priority 4   POLYPECTOMY N/A 03/20/2020   Procedure: POLYPECTOMY;  Surgeon: Virgel Manifold, MD;  Location:  [place];  Service: Endoscopy;  Laterality: N/A;   VAGINAL HYSTERECTOMY     VAGINAL PROLAPSE REPAIR      Medical History: Past Medical History:  Diagnosis Date   Hypertension     Family History: Family History  Problem Relation Age of Onset   Hypertension Mother    Colon cancer Mother    Pancreatic cancer Sister    Breast cancer Neg Hx     Social History   Socioeconomic History   Marital status: Married    Spouse name: Not on file   Number of children: Not on file   Years of education: Not on file   Highest education level: Not on file  Occupational History   Not on file  Tobacco Use   Smoking status: Never   Smokeless tobacco: Never  Vaping Use   Vaping Use: Never used  Substance and Sexual Activity   Alcohol use: No    Alcohol/week: 0.0 standard drinks of alcohol    Comment: rare wine   Drug use: No   Sexual activity: Not on file  Other Topics Concern   Not on file  Social History Narrative   Not on file   Social Determinants of Health   Financial Resource Strain: Not on file  Food Insecurity: Not on file  Transportation Needs: Not on file  Physical Activity: Not on file  Stress: Not on file  Social Connections: Not on file  Intimate Partner Violence: Not on file      Review of Systems  Constitutional:  Negative for chills, fatigue and unexpected weight change.  HENT:  Negative  for congestion, postnasal drip, rhinorrhea, sneezing and sore throat.   Eyes:  Negative for redness.  Respiratory:  Negative for cough, chest tightness and shortness of breath.   Cardiovascular:  Negative for chest pain and palpitations.  Gastrointestinal:  Negative for abdominal pain, constipation, diarrhea, nausea and vomiting.  Genitourinary:  Negative for dysuria and frequency.  Musculoskeletal:  Positive for arthralgias. Negative for back pain, joint swelling and neck pain.       Right hip pain and right ankle pain  Skin:  Negative for rash.  Neurological:  Negative.  Negative for tremors and numbness.  Hematological:  Negative for adenopathy. Does not bruise/bleed easily.  Psychiatric/Behavioral:  Negative for behavioral problems (Depression), sleep disturbance and suicidal ideas. The patient is not nervous/anxious.     Vital Signs: BP (!) 162/64 Comment: 161/72  Pulse 75   Temp 98.1 F (36.7 C)   Resp 16   Ht '5\' 8"'$  (1.727 m)   Wt 155 lb 12.8 oz (70.7 kg)   SpO2 98%   BMI 23.69 kg/m    Physical Exam Vitals and nursing note reviewed.  Constitutional:      General: She is not in acute distress.    Appearance: Normal appearance. She is well-developed and normal weight. She is not diaphoretic.  HENT:     Head: Normocephalic and atraumatic.     Mouth/Throat:     Pharynx: No oropharyngeal exudate.  Eyes:     Pupils: Pupils are equal, round, and reactive to light.  Neck:     Thyroid: No thyromegaly.     Vascular: No JVD.     Trachea: No tracheal deviation.  Cardiovascular:     Rate and Rhythm: Normal rate and regular rhythm.     Heart sounds: Normal heart sounds. No murmur heard.    No friction rub. No gallop.  Pulmonary:     Effort: Pulmonary effort is normal. No respiratory distress.     Breath sounds: No wheezing or rales.  Chest:     Chest wall: No tenderness.  Abdominal:     General: Bowel sounds are normal.     Palpations: Abdomen is soft.     Tenderness: There is no abdominal tenderness.  Musculoskeletal:        General: Normal range of motion.     Cervical back: Normal range of motion and neck supple.  Lymphadenopathy:     Cervical: No cervical adenopathy.  Skin:    General: Skin is warm and dry.  Neurological:     Mental Status: She is alert and oriented to person, place, and time.     Cranial Nerves: No cranial nerve deficit.  Psychiatric:        Behavior: Behavior normal.        Thought Content: Thought content normal.        Judgment: Judgment normal.        Assessment/Plan: 1. Essential  hypertension Will double amlodipine ('5mg'$  total) and continue losartan and monitor cloesly at home. Will recheck in 4 weeks. Advised to call prior if any problems.  2. Primary hyperparathyroidism ([place]) NM parathyroid scan did not show adenoma, however labs show hyperparathyroidism and patient continues to have multinodular thyroid. Will refer back to endocrinology for further eval and management - Ambulatory referral to Endocrinology  3. Multiple thyroid nodules - Ambulatory referral to Endocrinology   General Counseling: hermione havlicek understanding of the findings of todays visit and agrees with plan of treatment. I have discussed any further diagnostic evaluation  that may be needed or ordered today. We also reviewed her medications today. she has been encouraged to call the office with any questions or concerns that should arise related to todays visit.    Orders Placed This Encounter  Procedures   Ambulatory referral to Endocrinology    No orders of the defined types were placed in this encounter.   This patient was seen by Drema Dallas, PA-C in collaboration with Dr. Clayborn Bigness as a part of collaborative care agreement.   Total time spent:30 Minutes Time spent includes review of chart, medications, test results, and follow up plan with the patient.      Dr Lavera Guise Internal medicine

## 2022-05-08 NOTE — Telephone Encounter (Signed)
Awaiting 05/08/22 office notes for Endocrinology referral-Toni

## 2022-05-13 ENCOUNTER — Other Ambulatory Visit: Payer: Self-pay | Admitting: Physician Assistant

## 2022-05-13 DIAGNOSIS — Z1231 Encounter for screening mammogram for malignant neoplasm of breast: Secondary | ICD-10-CM

## 2022-05-22 ENCOUNTER — Telehealth: Payer: Self-pay | Admitting: Physician Assistant

## 2022-05-22 NOTE — Telephone Encounter (Signed)
Endocrinology referral faxed to Dr. Ronnald Collum; [phone]

## 2022-06-19 ENCOUNTER — Encounter: Payer: Self-pay | Admitting: Physician Assistant

## 2022-06-19 ENCOUNTER — Ambulatory Visit: Payer: Medicare Other | Admitting: Physician Assistant

## 2022-06-19 VITALS — BP 138/68 | HR 80 | Temp 98.5°F | Resp 16 | Ht 68.0 in | Wt 157.0 lb

## 2022-06-19 DIAGNOSIS — E210 Primary hyperparathyroidism: Secondary | ICD-10-CM | POA: Diagnosis not present

## 2022-06-19 DIAGNOSIS — I10 Essential (primary) hypertension: Secondary | ICD-10-CM | POA: Diagnosis not present

## 2022-06-19 MED ORDER — AMLODIPINE BESYLATE 10 MG PO TABS: 10.0000 mg | ORAL_TABLET | Freq: Every day | ORAL | 1 refills | Status: DC

## 2022-06-19 NOTE — Progress Notes (Signed)
[facility] [place],  [address]  Internal MEDICINE  Office Visit Note  Patient Name: Cindy Duke  [id number]  [id number]  Date of Service: 06/24/2022  Chief Complaint  Patient presents with   Follow-up   Hypertension    HPI Pt is here for routine follow up -Bp at home 140s/60s, this morning 150/74. BP is improved in office, but still borderline. She has been taking '5mg'$  amlodipine (2x2.'5mg'$ ) and her '50mg'$  losartan. Will go ahead and increase to '10mg'$  amlodipine and continue the losartan as before and have her monitor closely and call if any problems -Will be seeing endocrinology in Dec to follow up on primary hyperparathyroidism  Current Medication: Outpatient Encounter Medications as of 06/19/2022  Medication Sig   amLODipine (NORVASC) 10 MG tablet Take 1 tablet (10 mg total) by mouth daily.   losartan (COZAAR) 50 MG tablet TAKE (1) TABLET BY MOUTH EVERY DAY   meloxicam (MOBIC) 15 MG tablet Take 15 mg by mouth daily.   Multiple Vitamins-Minerals (MULTIVITAMIN WITH MINERALS) tablet Take 1 tablet by mouth daily.   Multiple Vitamins-Minerals (ZINC PO) Take by mouth daily.   [DISCONTINUED] amLODipine (NORVASC) 2.5 MG tablet TAKE (1) TABLET BY MOUTH EVERY DAY   [DISCONTINUED] escitalopram (LEXAPRO) 5 MG tablet TAKE (1) TABLET BY MOUTH EVERY DAY   No facility-administered encounter medications on file as of 06/19/2022.    Surgical History: Past Surgical History:  Procedure Laterality Date   BREAST BIOPSY Left    bx/clip-neg   COLONOSCOPY  2016   Dr Vira Agar- cleared for 5 yrs   COLONOSCOPY WITH PROPOFOL N/A 03/20/2020   Procedure: COLONOSCOPY WITH PROPOFOL;  Surgeon: Virgel Manifold, MD;  Location: [place];  Service: Endoscopy;  Laterality: N/A;  priority 4   POLYPECTOMY N/A 03/20/2020   Procedure: POLYPECTOMY;  Surgeon: Virgel Manifold, MD;  Location: [place];  Service: Endoscopy;  Laterality: N/A;    VAGINAL HYSTERECTOMY     VAGINAL PROLAPSE REPAIR      Medical History: Past Medical History:  Diagnosis Date   Hypertension     Family History: Family History  Problem Relation Age of Onset   Hypertension Mother    Colon cancer Mother    Pancreatic cancer Sister    Breast cancer Neg Hx     Social History   Socioeconomic History   Marital status: Married    Spouse name: Not on file   Number of children: Not on file   Years of education: Not on file   Highest education level: Not on file  Occupational History   Not on file  Tobacco Use   Smoking status: Never   Smokeless tobacco: Never  Vaping Use   Vaping Use: Never used  Substance and Sexual Activity   Alcohol use: No    Alcohol/week: 0.0 standard drinks of alcohol    Comment: rare wine   Drug use: No   Sexual activity: Not on file  Other Topics Concern   Not on file  Social History Narrative   Not on file   Social Determinants of Health   Financial Resource Strain: Not on file  Food Insecurity: Not on file  Transportation Needs: Not on file  Physical Activity: Not on file  Stress: Not on file  Social Connections: Not on file  Intimate Partner Violence: Not on file      Review of Systems  Constitutional:  Negative for chills, fatigue and unexpected weight change.  HENT:  Negative for congestion, postnasal drip, rhinorrhea, sneezing and sore throat.   Eyes:  Negative for redness.  Respiratory:  Negative for cough, chest tightness and shortness of breath.   Cardiovascular:  Negative for chest pain and palpitations.  Gastrointestinal:  Negative for abdominal pain, constipation, diarrhea, nausea and vomiting.  Genitourinary:  Negative for dysuria and frequency.  Musculoskeletal:  Positive for arthralgias. Negative for back pain, joint swelling and neck pain.       Right hip pain and right ankle pain  Skin:  Negative for rash.  Neurological: Negative.  Negative for tremors and numbness.  Hematological:   Negative for adenopathy. Does not bruise/bleed easily.  Psychiatric/Behavioral:  Negative for behavioral problems (Depression), sleep disturbance and suicidal ideas. The patient is not nervous/anxious.     Vital Signs: BP 138/68 Comment: 145/68  Pulse 80   Temp 98.5 F (36.9 C)   Resp 16   Ht '5\' 8"'$  (1.727 m)   Wt 157 lb (71.2 kg)   SpO2 98%   BMI 23.87 kg/m    Physical Exam Vitals and nursing note reviewed.  Constitutional:      General: She is not in acute distress.    Appearance: Normal appearance. She is well-developed and normal weight. She is not diaphoretic.  HENT:     Head: Normocephalic and atraumatic.     Mouth/Throat:     Pharynx: No oropharyngeal exudate.  Eyes:     Pupils: Pupils are equal, round, and reactive to light.  Neck:     Thyroid: No thyromegaly.     Vascular: No JVD.     Trachea: No tracheal deviation.  Cardiovascular:     Rate and Rhythm: Normal rate and regular rhythm.     Heart sounds: Normal heart sounds. No murmur heard.    No friction rub. No gallop.  Pulmonary:     Effort: Pulmonary effort is normal. No respiratory distress.     Breath sounds: No wheezing or rales.  Chest:     Chest wall: No tenderness.  Musculoskeletal:        General: Normal range of motion.     Cervical back: Normal range of motion and neck supple.  Lymphadenopathy:     Cervical: No cervical adenopathy.  Skin:    General: Skin is warm and dry.  Neurological:     Mental Status: She is alert and oriented to person, place, and time.     Cranial Nerves: No cranial nerve deficit.  Psychiatric:        Behavior: Behavior normal.        Thought Content: Thought content normal.        Judgment: Judgment normal.        Assessment/Plan: 1. Essential hypertension Will increase to '10mg'$  amlodipine and continue losartan as before. Continue to monitor and call office if any problems - amLODipine (NORVASC) 10 MG tablet; Take 1 tablet (10 mg total) by mouth daily.   Dispense: 90 tablet; Refill: 1  2. Primary hyperparathyroidism [facility]) Has appt with endocrinology in Dec   General Counseling: Truitt Leep understanding of the findings of todays visit and agrees with plan of treatment. I have discussed any further diagnostic evaluation that may be needed or ordered today. We also reviewed her medications today. she has been encouraged to call the office with any questions or concerns that should arise related to todays visit.    No orders of the defined types were placed in this encounter.   Meds ordered this encounter  Medications   amLODipine (NORVASC) 10 MG tablet    Sig: Take 1 tablet (10 mg total) by mouth daily.    Dispense:  90 tablet    Refill:  1    This patient was seen by Drema Dallas, PA-C in collaboration with Dr. Clayborn Bigness as a part of collaborative care agreement.   Total time spent:30 Minutes Time spent includes review of chart, medications, test results, and follow up plan with the patient.      Dr Lavera Guise Internal medicine

## 2022-06-21 ENCOUNTER — Other Ambulatory Visit: Payer: Self-pay | Admitting: Physician Assistant

## 2022-06-21 DIAGNOSIS — F419 Anxiety disorder, unspecified: Secondary | ICD-10-CM

## 2022-06-21 DIAGNOSIS — G479 Sleep disorder, unspecified: Secondary | ICD-10-CM

## 2022-06-25 ENCOUNTER — Ambulatory Visit
Admission: RE | Admit: 2022-06-25 | Discharge: 2022-06-25 | Disposition: A | Discharge status: Home / Self Care | Payer: Medicare Other | Source: Ambulatory Visit | Attending: Physician Assistant | Admitting: Physician Assistant

## 2022-06-25 DIAGNOSIS — Z1231 Encounter for screening mammogram for malignant neoplasm of breast: Secondary | ICD-10-CM | POA: Insufficient documentation

## 2022-06-26 DIAGNOSIS — L80 Vitiligo: Secondary | ICD-10-CM | POA: Diagnosis not present

## 2022-06-26 DIAGNOSIS — Z86018 Personal history of other benign neoplasm: Secondary | ICD-10-CM | POA: Diagnosis not present

## 2022-06-26 DIAGNOSIS — L218 Other seborrheic dermatitis: Secondary | ICD-10-CM | POA: Diagnosis not present

## 2022-06-26 DIAGNOSIS — L578 Other skin changes due to chronic exposure to nonionizing radiation: Secondary | ICD-10-CM | POA: Diagnosis not present

## 2022-07-15 DIAGNOSIS — I10 Essential (primary) hypertension: Secondary | ICD-10-CM | POA: Diagnosis not present

## 2022-07-15 DIAGNOSIS — D34 Benign neoplasm of thyroid gland: Secondary | ICD-10-CM | POA: Diagnosis not present

## 2022-07-15 DIAGNOSIS — E049 Nontoxic goiter, unspecified: Secondary | ICD-10-CM | POA: Diagnosis not present

## 2022-07-15 DIAGNOSIS — E039 Hypothyroidism, unspecified: Secondary | ICD-10-CM | POA: Diagnosis not present

## 2022-07-15 DIAGNOSIS — E210 Primary hyperparathyroidism: Secondary | ICD-10-CM | POA: Diagnosis not present

## 2022-07-15 DIAGNOSIS — E213 Hyperparathyroidism, unspecified: Secondary | ICD-10-CM | POA: Diagnosis not present

## 2022-07-16 ENCOUNTER — Telehealth: Payer: Self-pay

## 2022-07-16 NOTE — Telephone Encounter (Signed)
Done

## 2022-07-30 DIAGNOSIS — E210 Primary hyperparathyroidism: Secondary | ICD-10-CM | POA: Diagnosis not present

## 2022-07-30 DIAGNOSIS — E049 Nontoxic goiter, unspecified: Secondary | ICD-10-CM | POA: Diagnosis not present

## 2022-07-30 DIAGNOSIS — E039 Hypothyroidism, unspecified: Secondary | ICD-10-CM | POA: Diagnosis not present

## 2022-07-30 DIAGNOSIS — I10 Essential (primary) hypertension: Secondary | ICD-10-CM | POA: Diagnosis not present

## 2022-08-14 ENCOUNTER — Telehealth: Payer: Self-pay | Admitting: Physician Assistant

## 2022-08-14 ENCOUNTER — Other Ambulatory Visit: Payer: Self-pay

## 2022-08-14 DIAGNOSIS — I10 Essential (primary) hypertension: Secondary | ICD-10-CM

## 2022-08-14 MED ORDER — LOSARTAN POTASSIUM 50 MG PO TABS: ORAL_TABLET | ORAL | 3 refills | Status: DC

## 2022-08-14 NOTE — Telephone Encounter (Signed)
Done

## 2022-08-26 ENCOUNTER — Other Ambulatory Visit: Payer: Self-pay

## 2022-08-26 DIAGNOSIS — I10 Essential (primary) hypertension: Secondary | ICD-10-CM

## 2022-08-26 MED ORDER — LOSARTAN POTASSIUM 50 MG PO TABS: ORAL_TABLET | ORAL | 3 refills | Status: DC

## 2022-08-29 ENCOUNTER — Other Ambulatory Visit: Payer: Self-pay | Admitting: Physician Assistant

## 2022-08-29 DIAGNOSIS — I10 Essential (primary) hypertension: Secondary | ICD-10-CM

## 2022-09-03 DIAGNOSIS — D2271 Melanocytic nevi of right lower limb, including hip: Secondary | ICD-10-CM | POA: Diagnosis not present

## 2022-09-03 DIAGNOSIS — D485 Neoplasm of uncertain behavior of skin: Secondary | ICD-10-CM | POA: Diagnosis not present

## 2022-09-23 ENCOUNTER — Other Ambulatory Visit: Payer: Self-pay

## 2022-09-23 DIAGNOSIS — F419 Anxiety disorder, unspecified: Secondary | ICD-10-CM

## 2022-09-23 DIAGNOSIS — G479 Sleep disorder, unspecified: Secondary | ICD-10-CM

## 2022-09-23 MED ORDER — ESCITALOPRAM OXALATE 5 MG PO TABS: 5.0000 mg | ORAL_TABLET | Freq: Every day | ORAL | 1 refills | Status: DC

## 2022-10-16 ENCOUNTER — Ambulatory Visit: Payer: Medicare Other | Admitting: Physician Assistant

## 2022-10-16 ENCOUNTER — Encounter: Payer: Self-pay | Admitting: Physician Assistant

## 2022-10-16 VITALS — BP 130/68 | HR 71 | Temp 98.3°F | Resp 16 | Ht 68.0 in | Wt 154.8 lb

## 2022-10-16 DIAGNOSIS — I10 Essential (primary) hypertension: Secondary | ICD-10-CM | POA: Diagnosis not present

## 2022-10-16 NOTE — Progress Notes (Signed)
[facility] [place], [place] [address]  Internal MEDICINE  Office Visit Note  Patient Name: Cindy Duke  [id number]  [id number]  Date of Service: 10/16/2022  Chief Complaint  Patient presents with   Follow-up   Hypertension   Foot Swelling    Bilateral swelling    HPI Pt is here for routine follow up -Her amlodipine was increased to '10mg'$  last visit and initially did very well with this -BP at home has been rising a little again to 140-150s.  -Some foot swelling at night. No swelling on exam today. She states she has been renovating house and thinks this is contributing to swelling and BP elevation. She states her husband's BP has been a little elevated with this work as well.  -Discussed possible side effect of ankle swelling from amlodipine increase, therefore will drop back to 1/2 tab amlodipine ('5mg'$ ),  and will increase to 1.5tabs losartan ('75mg'$ ). Discussed if BP still high could increase to 2 tabs of losartan ('100mg'$ ) and she will let us know if new scripts needed for adjusted dosing. -Lexapro is helping at current dose -Following with endo for hyperparathyroidism and states things were ok, will have repeat labs in a few months  Current Medication: Outpatient Encounter Medications as of 10/16/2022  Medication Sig   amLODipine (NORVASC) 10 MG tablet TAKE 1 TABLET BY MOUTH EVERY DAY   escitalopram (LEXAPRO) 5 MG tablet Take 1 tablet (5 mg total) by mouth daily.   losartan (COZAAR) 50 MG tablet TAKE (1) TABLET BY MOUTH EVERY DAY   meloxicam (MOBIC) 15 MG tablet Take 15 mg by mouth daily.   Multiple Vitamins-Minerals (MULTIVITAMIN WITH MINERALS) tablet Take 1 tablet by mouth daily.   Multiple Vitamins-Minerals (ZINC PO) Take by mouth daily.   No facility-administered encounter medications on file as of 10/16/2022.    Surgical History: Past Surgical History:  Procedure Laterality Date   BREAST BIOPSY Left    bx/clip-neg   COLONOSCOPY  2016    Dr Vira Agar- cleared for 5 yrs   COLONOSCOPY WITH PROPOFOL N/A 03/20/2020   Procedure: COLONOSCOPY WITH PROPOFOL;  Surgeon: Virgel Manifold, MD;  Location: [place];  Service: Endoscopy;  Laterality: N/A;  priority 4   POLYPECTOMY N/A 03/20/2020   Procedure: POLYPECTOMY;  Surgeon: Virgel Manifold, MD;  Location: [place];  Service: Endoscopy;  Laterality: N/A;   VAGINAL HYSTERECTOMY     VAGINAL PROLAPSE REPAIR      Medical History: Past Medical History:  Diagnosis Date   Hypertension     Family History: Family History  Problem Relation Age of Onset   Hypertension Mother    Colon cancer Mother    Pancreatic cancer Sister    Breast cancer Neg Hx     Social History   Socioeconomic History   Marital status: Married    Spouse name: Not on file   Number of children: Not on file   Years of education: Not on file   Highest education level: Not on file  Occupational History   Not on file  Tobacco Use   Smoking status: Never   Smokeless tobacco: Never  Vaping Use   Vaping Use: Never used  Substance and Sexual Activity   Alcohol use: No    Alcohol/week: 0.0 standard drinks of alcohol    Comment: rare wine   Drug use: No   Sexual activity: Not on file  Other Topics Concern   Not on file  Social History  Narrative   Not on file   Social Determinants of Health   Financial Resource Strain: Not on file  Food Insecurity: Not on file  Transportation Needs: Not on file  Physical Activity: Not on file  Stress: Not on file  Social Connections: Not on file  Intimate Partner Violence: Not on file      Review of Systems  Constitutional:  Negative for chills, fatigue and unexpected weight change.  HENT:  Negative for congestion, postnasal drip, rhinorrhea, sneezing and sore throat.   Eyes:  Negative for redness.  Respiratory:  Negative for cough, chest tightness and shortness of breath.   Cardiovascular:  Positive for leg swelling. Negative  for chest pain and palpitations.  Gastrointestinal:  Negative for abdominal pain, constipation, diarrhea, nausea and vomiting.  Genitourinary:  Negative for dysuria and frequency.  Musculoskeletal:  Positive for arthralgias. Negative for back pain, joint swelling and neck pain.  Skin:  Negative for rash.  Neurological: Negative.  Negative for tremors and numbness.  Hematological:  Negative for adenopathy. Does not bruise/bleed easily.  Psychiatric/Behavioral:  Negative for behavioral problems (Depression), sleep disturbance and suicidal ideas. The patient is not nervous/anxious.     Vital Signs: BP 130/68 Comment: 140/68  Pulse 71   Temp 98.3 F (36.8 C)   Resp 16   Ht '5\' 8"'$  (1.727 m)   Wt 154 lb 12.8 oz (70.2 kg)   SpO2 97%   BMI 23.54 kg/m    Physical Exam Vitals and nursing note reviewed.  Constitutional:      General: She is not in acute distress.    Appearance: Normal appearance. She is well-developed and normal weight. She is not diaphoretic.  HENT:     Head: Normocephalic and atraumatic.     Mouth/Throat:     Pharynx: No oropharyngeal exudate.  Eyes:     Pupils: Pupils are equal, round, and reactive to light.  Neck:     Thyroid: No thyromegaly.     Vascular: No JVD.     Trachea: No tracheal deviation.  Cardiovascular:     Rate and Rhythm: Normal rate and regular rhythm.     Heart sounds: Normal heart sounds. No murmur heard.    No friction rub. No gallop.  Pulmonary:     Effort: Pulmonary effort is normal. No respiratory distress.     Breath sounds: No wheezing or rales.  Chest:     Chest wall: No tenderness.  Musculoskeletal:        General: Normal range of motion.     Cervical back: Normal range of motion and neck supple.     Right lower leg: No edema.     Left lower leg: No edema.  Lymphadenopathy:     Cervical: No cervical adenopathy.  Skin:    General: Skin is warm and dry.  Neurological:     Mental Status: She is alert and oriented to person,  place, and time.     Cranial Nerves: No cranial nerve deficit.  Psychiatric:        Behavior: Behavior normal.        Thought Content: Thought content normal.        Judgment: Judgment normal.        Assessment/Plan: 1. Essential hypertension Will adjust to 1/2 tab ('5mg'$ ) amlodipine and 1.5 tabs ('75mg'$ ) losartan and pt will monitor BP and ankle swelling. She will call for new script when needed or if any problems   General Counseling: Cindy Duke understanding of the  findings of todays visit and agrees with plan of treatment. I have discussed any further diagnostic evaluation that may be needed or ordered today. We also reviewed her medications today. she has been encouraged to call the office with any questions or concerns that should arise related to todays visit.    No orders of the defined types were placed in this encounter.   No orders of the defined types were placed in this encounter.   This patient was seen by Drema Dallas, PA-C in collaboration with Dr. Clayborn Bigness as a part of collaborative care agreement.   Total time spent:30 Minutes Time spent includes review of chart, medications, test results, and follow up plan with the patient.      Dr Lavera Guise Internal medicine

## 2022-12-31 ENCOUNTER — Telehealth (INDEPENDENT_AMBULATORY_CARE_PROVIDER_SITE_OTHER): Payer: Medicare Other | Admitting: Nurse Practitioner

## 2022-12-31 ENCOUNTER — Encounter: Payer: Self-pay | Admitting: Nurse Practitioner

## 2022-12-31 VITALS — Temp 97.8°F | Resp 16 | Ht 68.0 in | Wt 150.0 lb

## 2022-12-31 DIAGNOSIS — J0100 Acute maxillary sinusitis, unspecified: Secondary | ICD-10-CM

## 2022-12-31 DIAGNOSIS — R051 Acute cough: Secondary | ICD-10-CM

## 2022-12-31 MED ORDER — AMOXICILLIN-POT CLAVULANATE 875-125 MG PO TABS: 1.0000 | ORAL_TABLET | Freq: Two times a day (BID) | ORAL | 0 refills | Status: AC

## 2022-12-31 MED ORDER — PREDNISONE 10 MG (21) PO TBPK: ORAL_TABLET | ORAL | 0 refills | Status: DC

## 2022-12-31 MED ORDER — BENZONATATE 100 MG PO CAPS: 100.0000 mg | ORAL_CAPSULE | Freq: Two times a day (BID) | ORAL | 0 refills | Status: AC | PRN

## 2022-12-31 NOTE — Progress Notes (Signed)
[facility] [address] [place], [place] [address]  Internal MEDICINE  Telephone Visit  Patient Name: Cindy Duke  [id number]  [id number]  Date of Service: 12/31/2022  I connected with the patient at 1430 by telephone and verified the patients identity using two identifiers.   I discussed the limitations, risks, security and privacy concerns of performing an evaluation and management service by telephone and the availability of in person appointments. I also discussed with the patient that there may be a patient responsible charge related to the service.  The patient expressed understanding and agrees to proceed.    Chief Complaint  Patient presents with   Telephone Screen    Sore throat, cough, running nose, ear hurt--covid test was negative    Telephone Assessment    HPI Cindy Duke presents for a telehealth virtual visit for symptoms of upper respiratory infection.  Reports sore throat, cough, runny nose, and ear pain Covid test was negative Onset of symptoms was a few days ago.   Current Medication: Outpatient Encounter Medications as of 12/31/2022  Medication Sig   amoxicillin-clavulanate (AUGMENTIN) 875-125 MG tablet Take 1 tablet by mouth 2 (two) times daily for 10 days. Take with food.   benzonatate (TESSALON) 100 MG capsule Take 1 capsule (100 mg total) by mouth 2 (two) times daily as needed for cough.   predniSONE (STERAPRED UNI-PAK 21 TAB) 10 MG (21) TBPK tablet Use as directed for 6 days   amLODipine (NORVASC) 10 MG tablet TAKE 1 TABLET BY MOUTH EVERY DAY   escitalopram (LEXAPRO) 5 MG tablet Take 1 tablet (5 mg total) by mouth daily.   losartan (COZAAR) 50 MG tablet TAKE (1) TABLET BY MOUTH EVERY DAY   meloxicam (MOBIC) 15 MG tablet Take 15 mg by mouth daily.   Multiple Vitamins-Minerals (MULTIVITAMIN WITH MINERALS) tablet Take 1 tablet by mouth daily.   Multiple Vitamins-Minerals (ZINC PO) Take by mouth daily.   No facility-administered  encounter medications on file as of 12/31/2022.    Surgical History: Past Surgical History:  Procedure Laterality Date   BREAST BIOPSY Left    bx/clip-neg   COLONOSCOPY  2016   Dr Mechele Collin- cleared for 5 yrs   COLONOSCOPY WITH PROPOFOL N/A 03/20/2020   Procedure: COLONOSCOPY WITH PROPOFOL;  Surgeon: Pasty Spillers, MD;  Location: [facility];  Service: Endoscopy;  Laterality: N/A;  priority 4   POLYPECTOMY N/A 03/20/2020   Procedure: POLYPECTOMY;  Surgeon: Pasty Spillers, MD;  Location: [facility];  Service: Endoscopy;  Laterality: N/A;   VAGINAL HYSTERECTOMY     VAGINAL PROLAPSE REPAIR      Medical History: Past Medical History:  Diagnosis Date   Hypertension     Family History: Family History  Problem Relation Age of Onset   Hypertension Mother    Colon cancer Mother    Pancreatic cancer Sister    Breast cancer Neg Hx     Social History   Socioeconomic History   Marital status: Married    Spouse name: Not on file   Number of children: Not on file   Years of education: Not on file   Highest education level: Not on file  Occupational History   Not on file  Tobacco Use   Smoking status: Never   Smokeless tobacco: Never  Vaping Use   Vaping Use: Never used  Substance and Sexual Activity   Alcohol use: No    Alcohol/week: 0.0 standard drinks of alcohol    Comment: rare  wine   Drug use: No   Sexual activity: Not on file  Other Topics Concern   Not on file  Social History Narrative   Not on file   Social Determinants of Health   Financial Resource Strain: Not on file  Food Insecurity: Not on file  Transportation Needs: Not on file  Physical Activity: Not on file  Stress: Not on file  Social Connections: Not on file  Intimate Partner Violence: Not on file      Review of Systems  Constitutional:  Positive for fatigue. Negative for chills and fever.  HENT:  Positive for congestion, ear pain, postnasal drip, rhinorrhea, sinus  pressure, sore throat and trouble swallowing. Negative for sinus pain and sneezing.   Respiratory:  Positive for cough, chest tightness and shortness of breath. Negative for wheezing.   Cardiovascular: Negative.  Negative for chest pain and palpitations.  Gastrointestinal:  Negative for diarrhea, nausea and vomiting.  Musculoskeletal:  Negative for myalgias.  Neurological:  Negative for headaches.    Vital Signs: Temp 97.8 F (36.6 C)   Resp 16   Ht 5\' 8"  (1.727 m)   Wt 150 lb (68 kg)   BMI 22.81 kg/m    Observation/Objective: She is alert and oriented and engages in conversation appropriately. No acute distress noted.     Assessment/Plan: 1. Acute non-recurrent maxillary sinusitis Empiric antibiotic treatment and prednisone taper prescribed.  - predniSONE (STERAPRED UNI-PAK 21 TAB) 10 MG (21) TBPK tablet; Use as directed for 6 days  Dispense: 21 tablet; Refill: 0 - amoxicillin-clavulanate (AUGMENTIN) 875-125 MG tablet; Take 1 tablet by mouth 2 (two) times daily for 10 days. Take with food.  Dispense: 20 tablet; Refill: 0  2. Acute cough Medication prescribed for cough - benzonatate (TESSALON) 100 MG capsule; Take 1 capsule (100 mg total) by mouth 2 (two) times daily as needed for cough.  Dispense: 30 capsule; Refill: 0   General Counseling: Cindy Duke understanding of the findings of today's phone visit and agrees with plan of treatment. I have discussed any further diagnostic evaluation that may be needed or ordered today. We also reviewed her medications today. she has been encouraged to call the office with any questions or concerns that should arise related to todays visit.  Return if symptoms worsen or fail to improve.   No orders of the defined types were placed in this encounter.   Meds ordered this encounter  Medications   predniSONE (STERAPRED UNI-PAK 21 TAB) 10 MG (21) TBPK tablet    Sig: Use as directed for 6 days    Dispense:  21 tablet    Refill:   0   amoxicillin-clavulanate (AUGMENTIN) 875-125 MG tablet    Sig: Take 1 tablet by mouth 2 (two) times daily for 10 days. Take with food.    Dispense:  20 tablet    Refill:  0   benzonatate (TESSALON) 100 MG capsule    Sig: Take 1 capsule (100 mg total) by mouth 2 (two) times daily as needed for cough.    Dispense:  30 capsule    Refill:  0    Time spent:10 Minutes Time spent with patient included reviewing progress notes, labs, imaging studies, and discussing plan for follow up.  [place] Controlled Substance Database was reviewed by me for overdose risk score (ORS) if appropriate.  This patient was seen by Sallyanne Kuster, FNP-C in collaboration with Dr. Beverely Risen as a part of collaborative care agreement.  Nickolaos Brallier R. Tedd Sias, MSN, FNP-C  Internal medicine

## 2023-01-22 DIAGNOSIS — E042 Nontoxic multinodular goiter: Secondary | ICD-10-CM | POA: Diagnosis not present

## 2023-01-22 DIAGNOSIS — E039 Hypothyroidism, unspecified: Secondary | ICD-10-CM | POA: Diagnosis not present

## 2023-01-22 DIAGNOSIS — D34 Benign neoplasm of thyroid gland: Secondary | ICD-10-CM | POA: Diagnosis not present

## 2023-01-22 DIAGNOSIS — I10 Essential (primary) hypertension: Secondary | ICD-10-CM | POA: Diagnosis not present

## 2023-01-28 ENCOUNTER — Other Ambulatory Visit: Payer: Self-pay | Admitting: Physician Assistant

## 2023-01-28 DIAGNOSIS — I10 Essential (primary) hypertension: Secondary | ICD-10-CM | POA: Diagnosis not present

## 2023-01-28 DIAGNOSIS — G479 Sleep disorder, unspecified: Secondary | ICD-10-CM

## 2023-01-28 DIAGNOSIS — F419 Anxiety disorder, unspecified: Secondary | ICD-10-CM

## 2023-02-11 ENCOUNTER — Telehealth: Payer: Self-pay | Admitting: Physician Assistant

## 2023-02-11 NOTE — Telephone Encounter (Signed)
Left vm and sent mychart message to confirm 02/20/23 appointment-Toni

## 2023-02-20 ENCOUNTER — Ambulatory Visit (INDEPENDENT_AMBULATORY_CARE_PROVIDER_SITE_OTHER): Payer: Medicare Other | Admitting: Physician Assistant

## 2023-02-20 ENCOUNTER — Encounter: Payer: Self-pay | Admitting: Physician Assistant

## 2023-02-20 VITALS — BP 136/68 | HR 75 | Temp 97.8°F | Resp 16 | Ht 68.0 in | Wt 151.4 lb

## 2023-02-20 DIAGNOSIS — Z0001 Encounter for general adult medical examination with abnormal findings: Secondary | ICD-10-CM | POA: Diagnosis not present

## 2023-02-20 DIAGNOSIS — I10 Essential (primary) hypertension: Secondary | ICD-10-CM

## 2023-02-20 DIAGNOSIS — R300 Dysuria: Secondary | ICD-10-CM

## 2023-02-20 DIAGNOSIS — R5383 Other fatigue: Secondary | ICD-10-CM | POA: Diagnosis not present

## 2023-02-20 DIAGNOSIS — E782 Mixed hyperlipidemia: Secondary | ICD-10-CM

## 2023-02-20 MED ORDER — AMLODIPINE BESYLATE 5 MG PO TABS: 5.0000 mg | ORAL_TABLET | Freq: Every day | ORAL | 1 refills | Status: DC

## 2023-02-20 NOTE — Progress Notes (Signed)
[facility] [address] [place], [place] [address]  Internal MEDICINE  Office Visit Note  Patient Name: Cindy Duke  [id number]  [id number]  Date of Service: 03/01/2023  Chief Complaint  Patient presents with   Annual Exam   Hypertension     HPI Pt is here for routine health maintenance examination -Did see endo and labs have been stable, endo is retiring -taking 1.5 tabs losartan (75mg ), takes 1/2 amldopine (5mg ) hard to cut so will send new script -frequent nighttime urination, refuses cpap, will check urine -did get a letter she was due for colonoscopy and will call to schedule this. She had multiple polyps and mom had hx of colon CA -Bp at home has been stable -good appetite, [place]ating [place] and staying active     03/03/2022   10:59 AM 02/26/2021    8:44 AM 02/01/2019    9:41 AM 01/26/2018   10:44 AM  MMSE - Mini Mental State Exam  Orientation to time 5 5 5 5   Orientation to Place 5 5 5 5   Registration 3 3 3 3   Attention/ Calculation 5 5 5 5   Recall 3 3 3 3   Language- name 2 objects 2 2 2 2   Language- repeat 1 1 1 1   Language- follow 3 step command 3 3 3 3   Language- read & follow direction 1 1 1 1   Write a sentence 1 1 1 1   Copy design 1 1 1 1   Total score 30 30 30 30       03/03/2022   10:58 AM 04/07/2022    2:14 PM 06/19/2022   11:25 AM 10/16/2022    9:44 AM 02/20/2023    9:42 AM  Fall Risk  Falls in the past year? 0 0 0 0 0  (RETIRED) Patient Fall Risk Level  Low fall risk     Patient at Risk for Falls Due to  No Fall Risks     Fall risk Follow up  Falls evaluation completed       Current Medication: Outpatient Encounter Medications as of 02/20/2023  Medication Sig   amLODipine (NORVASC) 5 MG tablet Take 1 tablet (5 mg total) by mouth daily.   benzonatate (TESSALON) 100 MG capsule Take 1 capsule (100 mg total) by mouth 2 (two) times daily as needed for cough.   escitalopram (LEXAPRO) 5 MG tablet TAKE 1 TABLET(5 MG) BY  MOUTH DAILY   losartan (COZAAR) 50 MG tablet TAKE (1) TABLET BY MOUTH EVERY DAY   meloxicam (MOBIC) 15 MG tablet Take 15 mg by mouth daily.   Multiple Vitamins-Minerals (MULTIVITAMIN WITH MINERALS) tablet Take 1 tablet by mouth daily.   Multiple Vitamins-Minerals (ZINC PO) Take by mouth daily.   [DISCONTINUED] amLODipine (NORVASC) 10 MG tablet TAKE 1 TABLET BY MOUTH EVERY DAY   [DISCONTINUED] predniSONE (STERAPRED UNI-PAK 21 TAB) 10 MG (21) TBPK tablet Use as directed for 6 days   No facility-administered encounter medications on file as of 02/20/2023.    Surgical History: Past Surgical History:  Procedure Laterality Date   BREAST BIOPSY Left    bx/clip-neg   COLONOSCOPY  2016   Dr Mechele Collin- cleared for 5 yrs   COLONOSCOPY WITH PROPOFOL N/A 03/20/2020   Procedure: COLONOSCOPY WITH PROPOFOL;  Surgeon: Pasty Spillers, MD;  Location: [facility];  Service: Endoscopy;  Laterality: N/A;  priority 4   POLYPECTOMY N/A 03/20/2020   Procedure: POLYPECTOMY;  Surgeon: Pasty Spillers, MD;  Location: [facility];  Service: Endoscopy;  Laterality: N/A;   VAGINAL HYSTERECTOMY     VAGINAL PROLAPSE REPAIR      Medical History: Past Medical History:  Diagnosis Date   Hypertension     Family History: Family History  Problem Relation Age of Onset   Hypertension Mother    Colon cancer Mother    Pancreatic cancer Sister    Breast cancer Neg Hx       Review of Systems  Constitutional:  Negative for chills, fatigue and unexpected weight change.  HENT:  Negative for congestion, postnasal drip, rhinorrhea, sneezing and sore throat.   Eyes:  Negative for redness.  Respiratory:  Negative for cough, chest tightness and shortness of breath.   Cardiovascular:  Negative for chest pain and palpitations.  Gastrointestinal:  Negative for abdominal pain, constipation, diarrhea, nausea and vomiting.  Genitourinary:  Negative for dysuria and frequency.  Musculoskeletal:  Positive  for arthralgias. Negative for back pain, joint swelling and neck pain.  Skin:  Negative for rash.  Neurological: Negative.  Negative for tremors and numbness.  Hematological:  Negative for adenopathy. Does not bruise/bleed easily.  Psychiatric/Behavioral:  Negative for behavioral problems (Depression), sleep disturbance and suicidal ideas. The patient is not nervous/anxious.      Vital Signs: BP 136/68 Comment: 128/55  Pulse 75   Temp 97.8 F (36.6 C)   Resp 16   Ht 5\' 8"  (1.727 m)   Wt 151 lb 6.4 oz (68.7 kg)   SpO2 98%   BMI 23.02 kg/m    Physical Exam Vitals and nursing note reviewed.  Constitutional:      General: She is not in acute distress.    Appearance: Normal appearance. She is well-developed and normal weight. She is not diaphoretic.  HENT:     Head: Normocephalic and atraumatic.     Mouth/Throat:     Pharynx: No oropharyngeal exudate.  Eyes:     Pupils: Pupils are equal, round, and reactive to light.  Neck:     Thyroid: No thyromegaly.     Vascular: No JVD.     Trachea: No tracheal deviation.  Cardiovascular:     Rate and Rhythm: Normal rate and regular rhythm.     Heart sounds: Normal heart sounds. No murmur heard.    No friction rub. No gallop.  Pulmonary:     Effort: Pulmonary effort is normal. No respiratory distress.     Breath sounds: No wheezing or rales.  Chest:     Chest wall: No tenderness.  Abdominal:     Tenderness: There is no abdominal tenderness.  Musculoskeletal:        General: Normal range of motion.     Cervical back: Normal range of motion and neck supple.     Right lower leg: No edema.     Left lower leg: No edema.  Lymphadenopathy:     Cervical: No cervical adenopathy.  Skin:    General: Skin is warm and dry.  Neurological:     Mental Status: She is alert and oriented to person, place, and time.     Cranial Nerves: No cranial nerve deficit.  Psychiatric:        Behavior: Behavior normal.        Thought Content: Thought  content normal.        Judgment: Judgment normal.      LABS: Recent Results (from the past 2160 hour(s))  UA/M w/rflx Culture, Routine     Status: None   Collection Time: 02/20/23  12:44 PM   Specimen: Urine   Urine  Result Value Ref Range   Specific Gravity, UA 1.016 1.005 - 1.030   pH, UA 6.0 5.0 - 7.5   Color, UA Yellow Yellow   Appearance Ur Clear Clear   Leukocytes,UA Negative Negative   Protein,UA Negative Negative/Trace   Glucose, UA Negative Negative   Ketones, UA Negative Negative   RBC, UA Negative Negative   Bilirubin, UA Negative Negative   Urobilinogen, Ur 1.0 0.2 - 1.0 mg/dL   Nitrite, UA Negative Negative   Microscopic Examination Comment     Comment: Microscopic follows if indicated.   Microscopic Examination See below:     Comment: Microscopic was indicated and was performed.   Urinalysis Reflex Comment     Comment: This specimen will not reflex to a Urine Culture.  Microscopic Examination     Status: None   Collection Time: 02/20/23 12:44 PM   Urine  Result Value Ref Range   WBC, UA None seen 0 - 5 /hpf   RBC, Urine None seen 0 - 2 /hpf   Epithelial Cells (non renal) 0-10 0 - 10 /hpf   Casts None seen None seen /lpf   Bacteria, UA None seen None seen/Few        Assessment/Plan: 1. Encounter for general adult medical examination with abnormal findings CPE performed, labs ordered, pt will schedule colonoscopy - CBC w/Diff/Platelet - Comprehensive metabolic panel - Lipid Panel With LDL/HDL Ratio  2. Essential hypertension Stable, continue current medication--will send lower script for 5mg  amlodipine to avoid cutting higher dose pills in half - amLODipine (NORVASC) 5 MG tablet; Take 1 tablet (5 mg total) by mouth daily.  Dispense: 90 tablet; Refill: 1  3. Mixed hyperlipidemia - Lipid Panel With LDL/HDL Ratio  4. Other fatigue - CBC w/Diff/Platelet - Comprehensive metabolic panel - Lipid Panel With LDL/HDL Ratio  5. Dysuria - UA/M w/rflx  Culture, Routine   General Counseling: latacia guye understanding of the findings of todays visit and agrees with plan of treatment. I have discussed any further diagnostic evaluation that may be needed or ordered today. We also reviewed her medications today. she has been encouraged to call the office with any questions or concerns that should arise related to todays visit.    Counseling:    Orders Placed This Encounter  Procedures   Microscopic Examination   UA/M w/rflx Culture, Routine   CBC w/Diff/Platelet   Comprehensive metabolic panel   Lipid Panel With LDL/HDL Ratio    Meds ordered this encounter  Medications   amLODipine (NORVASC) 5 MG tablet    Sig: Take 1 tablet (5 mg total) by mouth daily.    Dispense:  90 tablet    Refill:  1    This patient was seen by Lynn Ito, PA-C in collaboration with Dr. Beverely Risen as a part of collaborative care agreement.  Total time spent:35 Minutes  Time spent includes review of chart, medications, test results, and follow up plan with the patient.     Lyndon Code, MD  Internal Medicine

## 2023-02-21 LAB — UA/M W/RFLX CULTURE, ROUTINE
Bilirubin, UA: NEGATIVE
Glucose, UA: NEGATIVE
Ketones, UA: NEGATIVE
Leukocytes,UA: NEGATIVE
Nitrite, UA: NEGATIVE
Protein,UA: NEGATIVE
RBC, UA: NEGATIVE
Specific Gravity, UA: 1.016 (ref 1.005–1.030)
Urobilinogen, Ur: 1 mg/dL (ref 0.2–1.0)
pH, UA: 6 (ref 5.0–7.5)

## 2023-02-21 LAB — MICROSCOPIC EXAMINATION
Bacteria, UA: NONE SEEN
Casts: NONE SEEN /lpf
RBC, Urine: NONE SEEN /hpf (ref 0–2)
WBC, UA: NONE SEEN /hpf (ref 0–5)

## 2023-03-03 DIAGNOSIS — E782 Mixed hyperlipidemia: Secondary | ICD-10-CM | POA: Diagnosis not present

## 2023-03-03 DIAGNOSIS — R5383 Other fatigue: Secondary | ICD-10-CM | POA: Diagnosis not present

## 2023-03-03 DIAGNOSIS — Z0001 Encounter for general adult medical examination with abnormal findings: Secondary | ICD-10-CM | POA: Diagnosis not present

## 2023-03-04 LAB — LIPID PANEL WITH LDL/HDL RATIO
Cholesterol, Total: 196 mg/dL (ref 100–199)
HDL: 54 mg/dL (ref >39)
LDL Chol Calc (NIH): 121 mg/dL — ABNORMAL HIGH (ref 0–99)
LDL/HDL Ratio: 2.2 ratio (ref 0.0–3.2)
Triglycerides: 118 mg/dL (ref 0–149)
VLDL Cholesterol Cal: 21 mg/dL (ref 5–40)

## 2023-03-04 LAB — CBC WITH DIFFERENTIAL/PLATELET
Basophils Absolute: 0 10*3/uL (ref 0.0–0.2)
Basos: 1 %
EOS (ABSOLUTE): 0.1 10*3/uL (ref 0.0–0.4)
Eos: 3 %
Hematocrit: 41 % (ref 34.0–46.6)
Hemoglobin: 14.2 g/dL (ref 11.1–15.9)
Immature Grans (Abs): 0 10*3/uL (ref 0.0–0.1)
Immature Granulocytes: 1 %
Lymphocytes Absolute: 1 10*3/uL (ref 0.7–3.1)
Lymphs: 23 %
MCH: 31.8 pg (ref 26.6–33.0)
MCHC: 34.6 g/dL (ref 31.5–35.7)
MCV: 92 fL (ref 79–97)
Monocytes Absolute: 0.4 10*3/uL (ref 0.1–0.9)
Monocytes: 9 %
Neutrophils Absolute: 2.8 10*3/uL (ref 1.4–7.0)
Neutrophils: 63 %
Platelets: 235 10*3/uL (ref 150–450)
RBC: 4.46 x10E6/uL (ref 3.77–5.28)
RDW: 12.5 % (ref 11.7–15.4)
WBC: 4.5 10*3/uL (ref 3.4–10.8)

## 2023-03-04 LAB — COMPREHENSIVE METABOLIC PANEL
ALT: 14 IU/L (ref 0–32)
AST: 17 IU/L (ref 0–40)
Albumin: 4.4 g/dL (ref 3.8–4.8)
Alkaline Phosphatase: 112 IU/L (ref 44–121)
BUN/Creatinine Ratio: 21 (ref 12–28)
BUN: 14 mg/dL (ref 8–27)
Bilirubin Total: 0.4 mg/dL (ref 0.0–1.2)
CO2: 22 mmol/L (ref 20–29)
Calcium: 10.4 mg/dL — ABNORMAL HIGH (ref 8.7–10.3)
Chloride: 106 mmol/L (ref 96–106)
Creatinine, Ser: 0.66 mg/dL (ref 0.57–1.00)
Globulin, Total: 1.9 g/dL (ref 1.5–4.5)
Glucose: 101 mg/dL — ABNORMAL HIGH (ref 70–99)
Potassium: 4.3 mmol/L (ref 3.5–5.2)
Sodium: 143 mmol/L (ref 134–144)
Total Protein: 6.3 g/dL (ref 6.0–8.5)
eGFR: 90 mL/min/{1.73_m2} (ref >59)

## 2023-03-09 ENCOUNTER — Other Ambulatory Visit: Payer: Self-pay

## 2023-03-09 DIAGNOSIS — I10 Essential (primary) hypertension: Secondary | ICD-10-CM

## 2023-03-09 MED ORDER — LOSARTAN POTASSIUM 50 MG PO TABS: ORAL_TABLET | ORAL | 1 refills | Status: DC

## 2023-03-09 NOTE — Telephone Encounter (Signed)
As per lauren  sent losartan 50 mg 1 and 1/2 tab daily

## 2023-03-13 ENCOUNTER — Telehealth: Payer: Self-pay

## 2023-03-13 NOTE — Telephone Encounter (Signed)
Left message for patient regarding lab results. 

## 2023-03-13 NOTE — Telephone Encounter (Signed)
-----   Message from Carlean Jews sent at 03/11/2023 11:40 AM EDT ----- Please let her know her calcium is still a little elevated at 10.4, consistent with last endo visit. LDL cholesterol is up slightly and should continue to work on this, otherwise labs look good

## 2023-05-11 ENCOUNTER — Telehealth: Payer: Self-pay

## 2023-05-11 NOTE — Telephone Encounter (Signed)
Patient is calling to schedule her repeat colonoscopy with Dr. Maximino Greenland. Please call patient to schedule

## 2023-05-12 ENCOUNTER — Other Ambulatory Visit: Payer: Self-pay | Admitting: *Deleted

## 2023-05-12 ENCOUNTER — Telehealth: Payer: Self-pay | Admitting: *Deleted

## 2023-05-12 DIAGNOSIS — Z860100 Personal history of colon polyps, unspecified: Secondary | ICD-10-CM

## 2023-05-12 DIAGNOSIS — Z800 Family history of malignant neoplasm of digestive organs: Secondary | ICD-10-CM

## 2023-05-12 MED ORDER — NA SULFATE-K SULFATE-MG SULF 17.5-3.13-1.6 GM/177ML PO SOLN: 1.0000 | Freq: Once | ORAL | 0 refills | Status: AC

## 2023-05-12 NOTE — Telephone Encounter (Signed)
Colonoscopy schedule on 06/22/2023 with Dr Servando Snare in [facility]

## 2023-05-12 NOTE — Telephone Encounter (Addendum)
Gastroenterology Pre-Procedure Review  Request Date: 06/22/2023 Requesting Physician: Dr. Servando Snare  PATIENT REVIEW QUESTIONS: The patient responded to the following health history questions as indicated:    1. Are you having any GI issues? no 2. Do you have a personal history of Polyps? yes (last colonoscopy on 03/20/2020 with Dr Maximino Greenland) 3. Do you have a family history of Colon Cancer or Polyps? yes(mother had colon cancer) 4. Diabetes Mellitus? no 5. Joint replacements in the past 12 months?no 6. Major health problems in the past 3 months?no 7. Any artificial heart valves, MVP, or defibrillator?no    MEDICATIONS & ALLERGIES:    Patient reports the following regarding taking any anticoagulation/antiplatelet therapy:   Plavix, Coumadin, Eliquis, Xarelto, Lovenox, Pradaxa, Brilinta, or Effient? no Aspirin? no  Patient confirms/reports the following medications:  Current Outpatient Medications  Medication Sig Dispense Refill   amLODipine (NORVASC) 5 MG tablet Take 1 tablet (5 mg total) by mouth daily. 90 tablet 1   benzonatate (TESSALON) 100 MG capsule Take 1 capsule (100 mg total) by mouth 2 (two) times daily as needed for cough. 30 capsule 0   escitalopram (LEXAPRO) 5 MG tablet TAKE 1 TABLET(5 MG) BY MOUTH DAILY 90 tablet 1   losartan (COZAAR) 50 MG tablet TAKE 1 and 1/2 TABLET BY MOUTH EVERY DAY 145 tablet 1   meloxicam (MOBIC) 15 MG tablet Take 15 mg by mouth daily.     Multiple Vitamins-Minerals (MULTIVITAMIN WITH MINERALS) tablet Take 1 tablet by mouth daily.     Multiple Vitamins-Minerals (ZINC PO) Take by mouth daily.     No current facility-administered medications for this visit.    Patient confirms/reports the following allergies:  Allergies  Allergen Reactions   Codeine Nausea Only and Other (See Comments)    Headache    Hydrocodone Nausea Only   Macrobid [Nitrofurantoin]     Stomach upset    No orders of the defined types were placed in this  encounter.   AUTHORIZATION INFORMATION Primary Insurance: 1D#: Group #:  Secondary Insurance: 1D#: Group #:  SCHEDULE INFORMATION: Date: 06/22/2023 Time: Location:  [facility]

## 2023-05-22 DIAGNOSIS — H2513 Age-related nuclear cataract, bilateral: Secondary | ICD-10-CM | POA: Diagnosis not present

## 2023-05-22 DIAGNOSIS — H5203 Hypermetropia, bilateral: Secondary | ICD-10-CM | POA: Diagnosis not present

## 2023-06-04 ENCOUNTER — Other Ambulatory Visit: Payer: Self-pay | Admitting: Physician Assistant

## 2023-06-04 DIAGNOSIS — Z1231 Encounter for screening mammogram for malignant neoplasm of breast: Secondary | ICD-10-CM

## 2023-06-11 ENCOUNTER — Encounter: Payer: Self-pay | Admitting: Gastroenterology

## 2023-06-22 ENCOUNTER — Other Ambulatory Visit: Payer: Self-pay

## 2023-06-22 ENCOUNTER — Ambulatory Visit: Payer: Medicare Other | Admitting: General Practice

## 2023-06-22 ENCOUNTER — Encounter
Admission: RE | Disposition: A | Discharge status: Home / Self Care | Payer: Self-pay | Source: Home / Self Care | Attending: Gastroenterology

## 2023-06-22 ENCOUNTER — Ambulatory Visit
Admission: RE | Admit: 2023-06-22 | Discharge: 2023-06-22 | Disposition: A | Discharge status: Home / Self Care | Payer: Medicare Other | Attending: Gastroenterology | Admitting: Gastroenterology

## 2023-06-22 ENCOUNTER — Encounter: Payer: Self-pay | Admitting: Gastroenterology

## 2023-06-22 DIAGNOSIS — Z860100 Personal history of colon polyps, unspecified: Secondary | ICD-10-CM

## 2023-06-22 DIAGNOSIS — K5730 Diverticulosis of large intestine without perforation or abscess without bleeding: Secondary | ICD-10-CM | POA: Insufficient documentation

## 2023-06-22 DIAGNOSIS — I10 Essential (primary) hypertension: Secondary | ICD-10-CM | POA: Insufficient documentation

## 2023-06-22 DIAGNOSIS — K635 Polyp of colon: Secondary | ICD-10-CM

## 2023-06-22 DIAGNOSIS — F419 Anxiety disorder, unspecified: Secondary | ICD-10-CM | POA: Diagnosis not present

## 2023-06-22 DIAGNOSIS — Z1211 Encounter for screening for malignant neoplasm of colon: Secondary | ICD-10-CM | POA: Insufficient documentation

## 2023-06-22 DIAGNOSIS — Z800 Family history of malignant neoplasm of digestive organs: Secondary | ICD-10-CM | POA: Diagnosis not present

## 2023-06-22 DIAGNOSIS — D122 Benign neoplasm of ascending colon: Secondary | ICD-10-CM | POA: Diagnosis not present

## 2023-06-22 DIAGNOSIS — Z8249 Family history of ischemic heart disease and other diseases of the circulatory system: Secondary | ICD-10-CM | POA: Insufficient documentation

## 2023-06-22 DIAGNOSIS — Z09 Encounter for follow-up examination after completed treatment for conditions other than malignant neoplasm: Secondary | ICD-10-CM | POA: Diagnosis not present

## 2023-06-22 DIAGNOSIS — Z860101 Personal history of adenomatous and serrated colon polyps: Secondary | ICD-10-CM | POA: Diagnosis not present

## 2023-06-22 DIAGNOSIS — K641 Second degree hemorrhoids: Secondary | ICD-10-CM | POA: Insufficient documentation

## 2023-06-22 HISTORY — DX: Anxiety disorder, unspecified: F41.9

## 2023-06-22 HISTORY — PX: COLONOSCOPY WITH PROPOFOL: SHX5780

## 2023-06-22 SURGERY — COLONOSCOPY WITH PROPOFOL
Anesthesia: Monitor Anesthesia Care

## 2023-06-22 MED ORDER — PROPOFOL 10 MG/ML IV BOLUS
INTRAVENOUS | Status: DC | PRN
  Administered 2023-06-22: 100 mg via INTRAVENOUS

## 2023-06-22 MED ORDER — SODIUM CHLORIDE 0.9% FLUSH
10.0000 mL | INTRAVENOUS | Status: DC | PRN
  Administered 2023-06-22: 20 mL via INTRAVENOUS

## 2023-06-22 MED ORDER — PROPOFOL 1000 MG/100ML IV EMUL
INTRAVENOUS | Status: AC
  Filled 2023-06-22: qty 100

## 2023-06-22 MED ORDER — SODIUM CHLORIDE 0.9 % IV SOLN
INTRAVENOUS | Status: DC
Start: 2023-06-22 — End: 2023-06-22

## 2023-06-22 SURGICAL SUPPLY — 8 items
GOWN CVR UNV OPN BCK APRN NK (MISCELLANEOUS) ×2 IMPLANT
GOWN ISOL THUMB LOOP REG UNIV (MISCELLANEOUS) ×2
KIT PRC NS LF DISP ENDO (KITS) ×1 IMPLANT
KIT PROCEDURE OLYMPUS (KITS) ×1
MANIFOLD NEPTUNE II (INSTRUMENTS) ×1 IMPLANT
SNARE COLD EXACTO (MISCELLANEOUS) IMPLANT
TRAP ETRAP POLY (MISCELLANEOUS) IMPLANT
WATER STERILE IRR 250ML POUR (IV SOLUTION) ×1 IMPLANT

## 2023-06-22 NOTE — Anesthesia Preprocedure Evaluation (Signed)
Anesthesia Evaluation  Patient identified by MRN, date of birth, ID band Patient awake    Reviewed: Allergy & Precautions, H&P , NPO status , Patient's Chart, lab work & pertinent test results  Airway Mallampati: IV  TM Distance: <3 FB Neck ROM: Full   Comment: Do not have any view of oropharynx, would expect difficult airway if intubation were needed.  Dental no notable dental hx. (+) Caps   Pulmonary neg pulmonary ROS   Pulmonary exam normal breath sounds clear to auscultation       Cardiovascular hypertension, Normal cardiovascular exam Rhythm:Regular Rate:Normal     Neuro/Psych   Anxiety     negative neurological ROS  negative psych ROS   GI/Hepatic negative GI ROS, Neg liver ROS,,,  Endo/Other  negative endocrine ROS    Renal/GU negative Renal ROS  negative genitourinary   Musculoskeletal negative musculoskeletal ROS (+)    Abdominal   Peds negative pediatric ROS (+)  Hematology negative hematology ROS (+)   Anesthesia Other Findings HTN Anxiety   Reproductive/Obstetrics negative OB ROS                              Anesthesia Physical Anesthesia Plan  ASA: 2  Anesthesia Plan: General   Post-op Pain Management:    Induction: Intravenous  PONV Risk Score and Plan:   Airway Management Planned: Natural Airway and Nasal Cannula  Additional Equipment:   Intra-op Plan:   Post-operative Plan:   Informed Consent: I have reviewed the patients History and Physical, chart, labs and discussed the procedure including the risks, benefits and alternatives for the proposed anesthesia with the patient or authorized representative who has indicated his/her understanding and acceptance.     Dental Advisory Given  Plan Discussed with: Anesthesiologist, CRNA and Surgeon  Anesthesia Plan Comments: (Patient consented for risks of anesthesia including but not limited to:  - adverse  reactions to medications - risk of airway placement if required - damage to eyes, teeth, lips or other oral mucosa - nerve damage due to positioning  - sore throat or hoarseness - Damage to heart, brain, nerves, lungs, other parts of body or loss of life  Patient voiced understanding and assent.)         Anesthesia Quick Evaluation

## 2023-06-22 NOTE — H&P (Signed)
Midge Minium, MD [facility] [address], [address] [place], [place] [address] [phone] Fax : [phone]  Primary Care Physician:  Carlean Jews, PA-C Primary Gastroenterologist:  Dr. Servando Snare  Pre-Procedure History & Physical: HPI:  Cindy Duke is a 77 y.o. female is here for an colonoscopy.   Past Medical History:  Diagnosis Date   Hypertension     Past Surgical History:  Procedure Laterality Date   BREAST BIOPSY Left    bx/clip-neg   COLONOSCOPY  2016   Dr Mechele Collin- cleared for 5 yrs   COLONOSCOPY WITH PROPOFOL N/A 03/20/2020   Procedure: COLONOSCOPY WITH PROPOFOL;  Surgeon: Pasty Spillers, MD;  Location: [facility];  Service: Endoscopy;  Laterality: N/A;  priority 4   POLYPECTOMY N/A 03/20/2020   Procedure: POLYPECTOMY;  Surgeon: Pasty Spillers, MD;  Location: [facility];  Service: Endoscopy;  Laterality: N/A;   VAGINAL HYSTERECTOMY     VAGINAL PROLAPSE REPAIR      Prior to Admission medications   Medication Sig Start Date End Date Taking? Authorizing Provider  amLODipine (NORVASC) 5 MG tablet Take 1 tablet (5 mg total) by mouth daily. 02/20/23  Yes McDonough, Lauren K, PA-C  ascorbic acid (VITAMIN C) 500 MG tablet Take 500 mg by mouth daily.   Yes [provider]  escitalopram (LEXAPRO) 5 MG tablet TAKE 1 TABLET(5 MG) BY MOUTH DAILY 01/29/23  Yes McDonough, Lauren K, PA-C  losartan (COZAAR) 50 MG tablet TAKE 1 and 1/2 TABLET BY MOUTH EVERY DAY 03/09/23  Yes McDonough, Lauren K, PA-C  Multiple Vitamins-Minerals (MULTIVITAMIN WITH MINERALS) tablet Take 1 tablet by mouth daily.   Yes [provider]  Multiple Vitamins-Minerals (ZINC PO) Take by mouth daily.   Yes [provider]  vitamin C (ASCORBIC ACID) 250 MG tablet Take 250 mg by mouth daily.   Yes [provider]  benzonatate (TESSALON) 100 MG capsule Take 1 capsule (100 mg total) by mouth 2 (two) times daily as needed for  cough. Patient not taking: Reported on 06/11/2023 12/31/22   Sallyanne Kuster, NP  meloxicam (MOBIC) 15 MG tablet Take 15 mg by mouth daily. Patient not taking: Reported on 06/11/2023    [provider]    Allergies as of 05/12/2023 - Review Complete 02/20/2023  Allergen Reaction Noted   Codeine Nausea Only and Other (See Comments) 08/02/2013   Hydrocodone Nausea Only 09/08/2012   Macrobid [nitrofurantoin]  05/06/2021    Family History  Problem Relation Age of Onset   Hypertension Mother    Colon cancer Mother    Pancreatic cancer Sister    Breast cancer Neg Hx     Social History   Socioeconomic History   Marital status: Married    Spouse name: Not on file   Number of children: Not on file   Years of education: Not on file   Highest education level: Not on file  Occupational History   Not on file  Tobacco Use   Smoking status: Never   Smokeless tobacco: Never  Vaping Use   Vaping status: Never Used  Substance and Sexual Activity   Alcohol use: No    Comment: rare pina colada   Drug use: No   Sexual activity: Not on file  Other Topics Concern   Not on file  Social History Narrative   Not on file   Social Determinants of Health   Financial Resource Strain: Not on file  Food Insecurity: Not on file  Transportation Needs:  Not on file  Physical Activity: Not on file  Stress: Not on file  Social Connections: Not on file  Intimate Partner Violence: Not on file    Review of Systems: See HPI, otherwise negative ROS  Physical Exam: Ht 5\' 8"  (1.727 m)   Wt 68.5 kg   BMI 22.96 kg/m  General:   Alert,  pleasant and cooperative in NAD Head:  Normocephalic and atraumatic. Neck:  Supple; no masses or thyromegaly. Lungs:  Clear throughout to auscultation.    Heart:  Regular rate and rhythm. Abdomen:  Soft, nontender and nondistended. Normal bowel sounds, without guarding, and without rebound.   Neurologic:  Alert and  oriented x4;  grossly normal  neurologically.  Impression/Plan: Cindy Duke is here for an colonoscopy to be performed for a history of adenomatous polyps on 2021   Risks, benefits, limitations, and alternatives regarding  colonoscopy have been reviewed with the patient.  Questions have been answered.  All parties agreeable.   Midge Minium, MD  06/22/2023, 8:35 AM

## 2023-06-22 NOTE — Anesthesia Postprocedure Evaluation (Signed)
Anesthesia Post Note  Patient: Dominga Lammons  Procedure(s) Performed: COLONOSCOPY WITH PROPOFOL WITH POLYPECTOMY  Patient location during evaluation: PACU Anesthesia Type: MAC Level of consciousness: awake and alert Pain management: pain level controlled Vital Signs Assessment: post-procedure vital signs reviewed and stable Respiratory status: spontaneous breathing, nonlabored ventilation, respiratory function stable and patient connected to nasal cannula oxygen Cardiovascular status: blood pressure returned to baseline and stable Postop Assessment: no apparent nausea or vomiting Anesthetic complications: no   There were no known notable events for this encounter.   Last Vitals:  Vitals:   06/22/23 0926 06/22/23 0935  BP: (!) 99/57 118/76  Pulse: 79   Resp: 20   Temp:    SpO2: 96%     Last Pain:  Vitals:   06/22/23 0926  TempSrc:   PainSc: 0-No pain                 Marisue Humble

## 2023-06-22 NOTE — Op Note (Signed)
[facility] Patient Name: Cindy Duke Procedure Date: 06/22/2023 8:59 AM MRN: [id number] Account #: [id number] Date of Birth: 1946/07/20 Admit Type: Outpatient Age: 77 Room: [facility] OR ROOM 01 Gender: Female Note Status: Finalized Instrument Name: [id number] Procedure:             Colonoscopy Indications:           High risk colon cancer surveillance: Personal history                         of colonic polyps Providers:             Midge Minium MD, MD Referring MD:          Carlean Jews (Referring MD) Medicines:             Propofol per Anesthesia Complications:         No immediate complications. Procedure:             Pre-Anesthesia Assessment:                        - Prior to the procedure, a History and Physical was                         performed, and patient medications and allergies were                         reviewed. The patient's tolerance of previous                         anesthesia was also reviewed. The risks and benefits                         of the procedure and the sedation options and risks                         were discussed with the patient. All questions were                         answered, and informed consent was obtained. Prior                         Anticoagulants: The patient has taken no anticoagulant                         or antiplatelet agents. ASA Grade Assessment: II - A                         patient with mild systemic disease. After reviewing                         the risks and benefits, the patient was deemed in                         satisfactory condition to undergo the procedure.                        After obtaining informed consent, the colonoscope was  passed under direct vision. Throughout the procedure,                         the patient's blood pressure, pulse, and oxygen                         saturations were monitored continuously. The                          Colonoscope was introduced through the anus and                         advanced to the the cecum, identified by appendiceal                         orifice and ileocecal valve. The colonoscopy was                         performed without difficulty. The patient tolerated                         the procedure well. The quality of the bowel                         preparation was excellent. Findings:      The perianal and digital rectal examinations were normal.      Two sessile polyps were found in the ascending colon. The polyps were 3       to 5 mm in size. These polyps were removed with a cold snare. Resection       and retrieval were complete.      Multiple small-mouthed diverticula were found in the entire colon.      Non-bleeding internal hemorrhoids were found during retroflexion. The       hemorrhoids were Grade II (internal hemorrhoids that prolapse but reduce       spontaneously). Impression:            - Two 3 to 5 mm polyps in the ascending colon, removed                         with a cold snare. Resected and retrieved.                        - Diverticulosis in the entire examined colon.                        - Non-bleeding internal hemorrhoids. Recommendation:        - Discharge patient to home.                        - Resume previous diet.                        - Continue present medications.                        - Await pathology results.                        - Repeat colonoscopy is not recommended for  surveillance. Procedure Code(s):     --- Professional ---                        [phone], Colonoscopy, flexible; with removal of                         tumor(s), polyp(s), or other lesion(s) by snare                         technique Diagnosis Code(s):     --- Professional ---                        Z86.010, Personal history of colonic polyps                        D12.2, Benign neoplasm of ascending colon CPT copyright 2022  American Medical Association. All rights reserved. The codes documented in this report are preliminary and upon coder review may  be revised to meet current compliance requirements. Midge Minium MD, MD 06/22/2023 9:24:16 AM This report has been signed electronically. Number of Addenda: 0 Note Initiated On: 06/22/2023 8:59 AM Scope Withdrawal Time: 0 hours 7 minutes 55 seconds  Total Procedure Duration: 0 hours 15 minutes 41 seconds  Estimated Blood Loss:  Estimated blood loss: none.      [facility]

## 2023-06-22 NOTE — Transfer of Care (Addendum)
Immediate Anesthesia Transfer of Care Note  Patient: Cindy Duke  Procedure(s) Performed: COLONOSCOPY WITH PROPOFOL WITH POLYPECTOMY  Patient Location: PACU  Anesthesia Type: General, MAC  Level of Consciousness: awake, alert  and patient cooperative  Airway and Oxygen Therapy: Patient Spontanous Breathing and Patient connected to supplemental oxygen  Post-op Assessment: Post-op Vital signs reviewed, Patient's Cardiovascular Status Stable, Respiratory Function Stable, Patent Airway and No signs of Nausea or vomiting  Post-op Vital Signs: Reviewed and stable  Complications: There were no known notable events for this encounter.

## 2023-06-23 ENCOUNTER — Encounter: Payer: Self-pay | Admitting: Gastroenterology

## 2023-06-23 LAB — SURGICAL PATHOLOGY

## 2023-06-25 ENCOUNTER — Ambulatory Visit: Payer: Medicare Other | Admitting: Physician Assistant

## 2023-07-06 ENCOUNTER — Encounter: Payer: Self-pay | Admitting: Physician Assistant

## 2023-07-06 ENCOUNTER — Ambulatory Visit (INDEPENDENT_AMBULATORY_CARE_PROVIDER_SITE_OTHER): Payer: Medicare Other | Admitting: Physician Assistant

## 2023-07-06 VITALS — BP 135/80 | HR 80 | Temp 98.6°F | Resp 16 | Ht 68.0 in | Wt 150.6 lb

## 2023-07-06 DIAGNOSIS — E782 Mixed hyperlipidemia: Secondary | ICD-10-CM

## 2023-07-06 DIAGNOSIS — I10 Essential (primary) hypertension: Secondary | ICD-10-CM | POA: Diagnosis not present

## 2023-07-06 NOTE — Progress Notes (Signed)
[facility] [address] [place], [place] [address]  Internal MEDICINE  Office Visit Note  Patient Name: Cindy Duke  [id number]  [id number]  Date of Service: 07/06/2023  Chief Complaint  Patient presents with   Follow-up    Review lab results   Hypertension    HPI Pt is here for routine follow up and has no concerns today -labs previously reviewed over the phone -Bp stable -colonoscopy done a few weeks ago and states this went well and was told this was her last one  Current Medication: Outpatient Encounter Medications as of 07/06/2023  Medication Sig   amLODipine (NORVASC) 5 MG tablet Take 1 tablet (5 mg total) by mouth daily.   ascorbic acid (VITAMIN C) 500 MG tablet Take 500 mg by mouth daily.   benzonatate (TESSALON) 100 MG capsule Take 1 capsule (100 mg total) by mouth 2 (two) times daily as needed for cough.   escitalopram (LEXAPRO) 5 MG tablet TAKE 1 TABLET(5 MG) BY MOUTH DAILY   losartan (COZAAR) 50 MG tablet TAKE 1 and 1/2 TABLET BY MOUTH EVERY DAY   meloxicam (MOBIC) 15 MG tablet Take 15 mg by mouth daily.   Multiple Vitamins-Minerals (MULTIVITAMIN WITH MINERALS) tablet Take 1 tablet by mouth daily.   Multiple Vitamins-Minerals (ZINC PO) Take by mouth daily.   vitamin C (ASCORBIC ACID) 250 MG tablet Take 250 mg by mouth daily.   No facility-administered encounter medications on file as of 07/06/2023.    Surgical History: Past Surgical History:  Procedure Laterality Date   BREAST BIOPSY Left    bx/clip-neg   COLONOSCOPY  2016   Dr Mechele Collin- cleared for 5 yrs   COLONOSCOPY WITH PROPOFOL N/A 03/20/2020   Procedure: COLONOSCOPY WITH PROPOFOL;  Surgeon: Pasty Spillers, MD;  Location: [facility];  Service: Endoscopy;  Laterality: N/A;  priority 4   COLONOSCOPY WITH PROPOFOL N/A 06/22/2023   Procedure: COLONOSCOPY WITH PROPOFOL WITH POLYPECTOMY;  Surgeon: Midge Minium, MD;  Location: [facility];  Service:  Endoscopy;  Laterality: N/A;   POLYPECTOMY N/A 03/20/2020   Procedure: POLYPECTOMY;  Surgeon: Pasty Spillers, MD;  Location: [facility];  Service: Endoscopy;  Laterality: N/A;   VAGINAL HYSTERECTOMY     VAGINAL PROLAPSE REPAIR      Medical History: Past Medical History:  Diagnosis Date   Anxiety    Hypertension     Family History: Family History  Problem Relation Age of Onset   Hypertension Mother    Colon cancer Mother    Pancreatic cancer Sister    Breast cancer Neg Hx     Social History   Socioeconomic History   Marital status: Married    Spouse name: Not on file   Number of children: Not on file   Years of education: Not on file   Highest education level: Not on file  Occupational History   Not on file  Tobacco Use   Smoking status: Never   Smokeless tobacco: Never  Vaping Use   Vaping status: Never Used  Substance and Sexual Activity   Alcohol use: No    Comment: rare pina colada   Drug use: No   Sexual activity: Not on file  Other Topics Concern   Not on file  Social History Narrative   Not on file   Social Determinants of Health   Financial Resource Strain: Not on file  Food Insecurity: Not on file  Transportation Needs: Not on file  Physical Activity: Not  on file  Stress: Not on file  Social Connections: Not on file  Intimate Partner Violence: Not on file      Review of Systems  Constitutional:  Negative for chills, fatigue and unexpected weight change.  HENT:  Negative for congestion, postnasal drip, rhinorrhea, sneezing and sore throat.   Eyes:  Negative for redness.  Respiratory:  Negative for cough, chest tightness and shortness of breath.   Cardiovascular:  Negative for chest pain and palpitations.  Gastrointestinal:  Negative for abdominal pain, constipation, diarrhea, nausea and vomiting.  Genitourinary:  Negative for dysuria and frequency.  Musculoskeletal:  Positive for arthralgias. Negative for back pain, joint  swelling and neck pain.  Skin:  Negative for rash.  Neurological: Negative.  Negative for tremors and numbness.  Hematological:  Negative for adenopathy. Does not bruise/bleed easily.  Psychiatric/Behavioral:  Negative for behavioral problems (Depression), sleep disturbance and suicidal ideas. The patient is not nervous/anxious.     Vital Signs: BP 135/80   Pulse 80   Temp 98.6 F (37 C)   Resp 16   Ht 5\' 8"  (1.727 m)   Wt 150 lb 9.6 oz (68.3 kg)   SpO2 98%   BMI 22.90 kg/m    Physical Exam Vitals and nursing note reviewed.  Constitutional:      General: She is not in acute distress.    Appearance: Normal appearance. She is well-developed and normal weight. She is not diaphoretic.  HENT:     Head: Normocephalic and atraumatic.     Mouth/Throat:     Pharynx: No oropharyngeal exudate.  Eyes:     Pupils: Pupils are equal, round, and reactive to light.  Neck:     Thyroid: No thyromegaly.     Vascular: No JVD.     Trachea: No tracheal deviation.  Cardiovascular:     Rate and Rhythm: Normal rate and regular rhythm.     Heart sounds: Normal heart sounds. No murmur heard.    No friction rub. No gallop.  Pulmonary:     Effort: Pulmonary effort is normal. No respiratory distress.     Breath sounds: No wheezing or rales.  Chest:     Chest wall: No tenderness.  Musculoskeletal:        General: Normal range of motion.     Cervical back: Normal range of motion and neck supple.     Right lower leg: No edema.     Left lower leg: No edema.  Lymphadenopathy:     Cervical: No cervical adenopathy.  Skin:    General: Skin is warm and dry.  Neurological:     Mental Status: She is alert and oriented to person, place, and time.     Cranial Nerves: No cranial nerve deficit.  Psychiatric:        Behavior: Behavior normal.        Thought Content: Thought content normal.        Judgment: Judgment normal.        Assessment/Plan: 1. Essential hypertension Stable, continue  current medication  2. Mixed hyperlipidemia Continue to work on diet and exercise   General Counseling: Cindy Duke understanding of the findings of todays visit and agrees with plan of treatment. I have discussed any further diagnostic evaluation that may be needed or ordered today. We also reviewed her medications today. she has been encouraged to call the office with any questions or concerns that should arise related to todays visit.    No orders of the defined  types were placed in this encounter.   No orders of the defined types were placed in this encounter.   This patient was seen by Lynn Ito, PA-C in collaboration with Dr. Beverely Risen as a part of collaborative care agreement.   Total time spent:25 Minutes Time spent includes review of chart, medications, test results, and follow up plan with the patient.      Dr Lyndon Code Internal medicine

## 2023-07-15 ENCOUNTER — Ambulatory Visit: Payer: Medicare Other

## 2023-08-18 ENCOUNTER — Ambulatory Visit
Admission: RE | Admit: 2023-08-18 | Discharge: 2023-08-18 | Disposition: A | Discharge status: Home / Self Care | Payer: Medicare Other | Source: Ambulatory Visit | Attending: Physician Assistant | Admitting: Physician Assistant

## 2023-08-18 DIAGNOSIS — Z1231 Encounter for screening mammogram for malignant neoplasm of breast: Secondary | ICD-10-CM | POA: Diagnosis not present

## 2023-09-01 ENCOUNTER — Other Ambulatory Visit: Payer: Self-pay | Admitting: Physician Assistant

## 2023-09-01 DIAGNOSIS — I10 Essential (primary) hypertension: Secondary | ICD-10-CM

## 2023-09-02 ENCOUNTER — Other Ambulatory Visit: Payer: Self-pay | Admitting: Physician Assistant

## 2023-09-02 DIAGNOSIS — I10 Essential (primary) hypertension: Secondary | ICD-10-CM

## 2023-09-04 ENCOUNTER — Other Ambulatory Visit: Payer: Self-pay

## 2023-09-04 ENCOUNTER — Other Ambulatory Visit: Payer: Self-pay | Admitting: Physician Assistant

## 2023-09-04 DIAGNOSIS — I10 Essential (primary) hypertension: Secondary | ICD-10-CM

## 2023-09-04 MED ORDER — LOSARTAN POTASSIUM 50 MG PO TABS: 75.0000 mg | ORAL_TABLET | Freq: Every day | ORAL | 1 refills | Status: AC

## 2023-09-05 ENCOUNTER — Other Ambulatory Visit: Payer: Self-pay | Admitting: Physician Assistant

## 2023-09-05 DIAGNOSIS — G479 Sleep disorder, unspecified: Secondary | ICD-10-CM

## 2023-09-05 DIAGNOSIS — F419 Anxiety disorder, unspecified: Secondary | ICD-10-CM

## 2023-11-17 DIAGNOSIS — E782 Mixed hyperlipidemia: Secondary | ICD-10-CM | POA: Diagnosis not present

## 2023-11-17 DIAGNOSIS — F419 Anxiety disorder, unspecified: Secondary | ICD-10-CM | POA: Diagnosis not present

## 2023-11-17 DIAGNOSIS — Z131 Encounter for screening for diabetes mellitus: Secondary | ICD-10-CM | POA: Diagnosis not present

## 2023-11-17 DIAGNOSIS — I10 Essential (primary) hypertension: Secondary | ICD-10-CM | POA: Diagnosis not present

## 2023-11-17 DIAGNOSIS — Z1329 Encounter for screening for other suspected endocrine disorder: Secondary | ICD-10-CM | POA: Diagnosis not present

## 2023-11-17 DIAGNOSIS — Z1321 Encounter for screening for nutritional disorder: Secondary | ICD-10-CM | POA: Diagnosis not present

## 2024-02-25 ENCOUNTER — Encounter: Payer: Self-pay | Admitting: Physician Assistant

## 2024-03-03 ENCOUNTER — Ambulatory Visit: Payer: Medicare Other | Admitting: Physician Assistant

## 2024-03-04 ENCOUNTER — Other Ambulatory Visit: Payer: Self-pay | Admitting: Physician Assistant

## 2024-03-04 DIAGNOSIS — G479 Sleep disorder, unspecified: Secondary | ICD-10-CM

## 2024-03-04 DIAGNOSIS — I10 Essential (primary) hypertension: Secondary | ICD-10-CM

## 2024-03-04 DIAGNOSIS — F419 Anxiety disorder, unspecified: Secondary | ICD-10-CM

## 2024-03-05 ENCOUNTER — Other Ambulatory Visit: Payer: Self-pay | Admitting: Physician Assistant

## 2024-03-05 DIAGNOSIS — I10 Essential (primary) hypertension: Secondary | ICD-10-CM

## 2024-03-07 ENCOUNTER — Other Ambulatory Visit: Payer: Self-pay

## 2024-03-07 DIAGNOSIS — I10 Essential (primary) hypertension: Secondary | ICD-10-CM

## 2024-06-23 ENCOUNTER — Other Ambulatory Visit: Payer: Self-pay | Admitting: Physician Assistant

## 2024-06-23 DIAGNOSIS — I10 Essential (primary) hypertension: Secondary | ICD-10-CM

## 2024-07-04 ENCOUNTER — Other Ambulatory Visit: Payer: Self-pay | Admitting: Internal Medicine

## 2024-07-04 DIAGNOSIS — Z1231 Encounter for screening mammogram for malignant neoplasm of breast: Secondary | ICD-10-CM

## 2024-08-18 ENCOUNTER — Ambulatory Visit
Admission: RE | Admit: 2024-08-18 | Discharge: 2024-08-18 | Disposition: A | Discharge status: Home / Self Care | Source: Ambulatory Visit | Attending: Internal Medicine | Admitting: Internal Medicine

## 2024-08-18 DIAGNOSIS — Z1231 Encounter for screening mammogram for malignant neoplasm of breast: Secondary | ICD-10-CM | POA: Diagnosis present

## 2024-08-24 ENCOUNTER — Other Ambulatory Visit: Payer: Self-pay | Admitting: Internal Medicine

## 2024-08-24 DIAGNOSIS — R928 Other abnormal and inconclusive findings on diagnostic imaging of breast: Secondary | ICD-10-CM

## 2024-08-30 ENCOUNTER — Ambulatory Visit
Admission: RE | Admit: 2024-08-30 | Discharge: 2024-08-30 | Disposition: A | Discharge status: Home / Self Care | Source: Ambulatory Visit | Attending: Internal Medicine | Admitting: Internal Medicine

## 2024-08-30 ENCOUNTER — Inpatient Hospital Stay
Admission: RE | Admit: 2024-08-30 | Discharge: 2024-08-30 | Disposition: A | Source: Ambulatory Visit | Attending: Internal Medicine | Admitting: Internal Medicine

## 2024-08-30 DIAGNOSIS — R928 Other abnormal and inconclusive findings on diagnostic imaging of breast: Secondary | ICD-10-CM
